# Patient Record
Sex: Male | Born: 1935 | Race: White | Hispanic: No | Marital: Married | Smoking: Former smoker
Health system: Southern US, Community
[De-identification: ages and names within clinical notes are randomized; demographics above are authoritative.]

## PROBLEM LIST (undated history)

## (undated) DIAGNOSIS — N400 Enlarged prostate without lower urinary tract symptoms: Secondary | ICD-10-CM

## (undated) DIAGNOSIS — G4730 Sleep apnea, unspecified: Secondary | ICD-10-CM

## (undated) DIAGNOSIS — K219 Gastro-esophageal reflux disease without esophagitis: Secondary | ICD-10-CM

## (undated) DIAGNOSIS — H3320 Serous retinal detachment, unspecified eye: Secondary | ICD-10-CM

## (undated) DIAGNOSIS — C801 Malignant (primary) neoplasm, unspecified: Secondary | ICD-10-CM

## (undated) DIAGNOSIS — E039 Hypothyroidism, unspecified: Secondary | ICD-10-CM

## (undated) DIAGNOSIS — H35379 Puckering of macula, unspecified eye: Secondary | ICD-10-CM

## (undated) DIAGNOSIS — Z860100 Personal history of colon polyps, unspecified: Secondary | ICD-10-CM

## (undated) DIAGNOSIS — R972 Elevated prostate specific antigen [PSA]: Secondary | ICD-10-CM

## (undated) DIAGNOSIS — H59023 Cataract (lens) fragments in eye following cataract surgery, bilateral: Secondary | ICD-10-CM

## (undated) DIAGNOSIS — D132 Benign neoplasm of duodenum: Secondary | ICD-10-CM

## (undated) HISTORY — DX: Cataract (lens) fragments in eye following cataract surgery, bilateral: H59.023

## (undated) HISTORY — DX: Personal history of colon polyps, unspecified: Z86.0100

## (undated) HISTORY — DX: Elevated prostate specific antigen (PSA): R97.20

## (undated) HISTORY — PX: OTHER SURGICAL HISTORY: SHX169

## (undated) HISTORY — DX: Benign neoplasm of duodenum: D13.2

## (undated) HISTORY — DX: Serous retinal detachment, unspecified eye: H33.20

## (undated) HISTORY — DX: Puckering of macula, unspecified eye: H35.379

## (undated) HISTORY — DX: Benign prostatic hyperplasia without lower urinary tract symptoms: N40.0

## (undated) HISTORY — PX: TONSILLECTOMY: SUR1361

## (undated) HISTORY — PX: UPPER GI ENDOSCOPY: SHX6162

## (undated) HISTORY — DX: Gastro-esophageal reflux disease without esophagitis: K21.9

## (undated) HISTORY — DX: Personal history of colonic polyps: Z86.010

---

## 1980-07-14 DIAGNOSIS — H3320 Serous retinal detachment, unspecified eye: Secondary | ICD-10-CM

## 1980-07-14 HISTORY — DX: Serous retinal detachment, unspecified eye: H33.20

## 1982-07-14 DIAGNOSIS — H35379 Puckering of macula, unspecified eye: Secondary | ICD-10-CM

## 1982-07-14 HISTORY — DX: Puckering of macula, unspecified eye: H35.379

## 1998-06-28 ENCOUNTER — Other Ambulatory Visit: Admission: RE | Admit: 1998-06-28 | Discharge: 1998-06-28 | Payer: Self-pay | Admitting: Urology

## 1999-07-04 ENCOUNTER — Other Ambulatory Visit: Admission: RE | Admit: 1999-07-04 | Discharge: 1999-07-04 | Payer: Self-pay | Admitting: Urology

## 1999-07-15 DIAGNOSIS — N400 Enlarged prostate without lower urinary tract symptoms: Secondary | ICD-10-CM

## 1999-07-15 HISTORY — DX: Benign prostatic hyperplasia without lower urinary tract symptoms: N40.0

## 2000-06-25 ENCOUNTER — Ambulatory Visit (HOSPITAL_COMMUNITY): Admission: RE | Admit: 2000-06-25 | Discharge: 2000-06-25 | Payer: Self-pay | Admitting: Gastroenterology

## 2000-07-14 DIAGNOSIS — D132 Benign neoplasm of duodenum: Secondary | ICD-10-CM

## 2000-07-14 HISTORY — DX: Benign neoplasm of duodenum: D13.2

## 2001-01-15 ENCOUNTER — Ambulatory Visit (HOSPITAL_COMMUNITY): Admission: RE | Admit: 2001-01-15 | Discharge: 2001-01-15 | Payer: Self-pay | Admitting: Gastroenterology

## 2003-09-11 ENCOUNTER — Inpatient Hospital Stay (HOSPITAL_COMMUNITY): Admission: RE | Admit: 2003-09-11 | Discharge: 2003-09-13 | Payer: Self-pay | Admitting: Gastroenterology

## 2003-10-05 ENCOUNTER — Ambulatory Visit (HOSPITAL_COMMUNITY): Admission: RE | Admit: 2003-10-05 | Discharge: 2003-10-05 | Payer: Self-pay | Admitting: Gastroenterology

## 2005-03-04 ENCOUNTER — Ambulatory Visit (HOSPITAL_BASED_OUTPATIENT_CLINIC_OR_DEPARTMENT_OTHER): Admission: RE | Admit: 2005-03-04 | Discharge: 2005-03-04 | Payer: Self-pay | Admitting: Internal Medicine

## 2005-03-06 ENCOUNTER — Encounter: Admission: RE | Admit: 2005-03-06 | Discharge: 2005-03-06 | Payer: Self-pay | Admitting: Orthopedic Surgery

## 2005-03-09 ENCOUNTER — Ambulatory Visit: Payer: Self-pay | Admitting: Internal Medicine

## 2005-04-02 ENCOUNTER — Encounter: Admission: RE | Admit: 2005-04-02 | Discharge: 2005-04-02 | Payer: Self-pay | Admitting: Orthopedic Surgery

## 2016-09-09 DIAGNOSIS — D225 Melanocytic nevi of trunk: Secondary | ICD-10-CM | POA: Diagnosis not present

## 2016-09-09 DIAGNOSIS — M7130 Other bursal cyst, unspecified site: Secondary | ICD-10-CM | POA: Diagnosis not present

## 2016-09-09 DIAGNOSIS — L821 Other seborrheic keratosis: Secondary | ICD-10-CM | POA: Diagnosis not present

## 2016-09-09 DIAGNOSIS — Z85828 Personal history of other malignant neoplasm of skin: Secondary | ICD-10-CM | POA: Diagnosis not present

## 2016-09-09 DIAGNOSIS — D2262 Melanocytic nevi of left upper limb, including shoulder: Secondary | ICD-10-CM | POA: Diagnosis not present

## 2016-09-09 DIAGNOSIS — L570 Actinic keratosis: Secondary | ICD-10-CM | POA: Diagnosis not present

## 2016-09-09 DIAGNOSIS — D1801 Hemangioma of skin and subcutaneous tissue: Secondary | ICD-10-CM | POA: Diagnosis not present

## 2016-10-15 DIAGNOSIS — D3131 Benign neoplasm of right choroid: Secondary | ICD-10-CM | POA: Diagnosis not present

## 2016-10-15 DIAGNOSIS — D3132 Benign neoplasm of left choroid: Secondary | ICD-10-CM | POA: Diagnosis not present

## 2016-10-15 DIAGNOSIS — H5203 Hypermetropia, bilateral: Secondary | ICD-10-CM | POA: Diagnosis not present

## 2016-10-15 DIAGNOSIS — H524 Presbyopia: Secondary | ICD-10-CM | POA: Diagnosis not present

## 2016-10-15 DIAGNOSIS — Z8669 Personal history of other diseases of the nervous system and sense organs: Secondary | ICD-10-CM | POA: Diagnosis not present

## 2016-10-15 DIAGNOSIS — Z961 Presence of intraocular lens: Secondary | ICD-10-CM | POA: Diagnosis not present

## 2016-10-15 DIAGNOSIS — H52203 Unspecified astigmatism, bilateral: Secondary | ICD-10-CM | POA: Diagnosis not present

## 2016-12-31 DIAGNOSIS — R972 Elevated prostate specific antigen [PSA]: Secondary | ICD-10-CM | POA: Diagnosis not present

## 2016-12-31 DIAGNOSIS — R946 Abnormal results of thyroid function studies: Secondary | ICD-10-CM | POA: Diagnosis not present

## 2017-05-18 DIAGNOSIS — N400 Enlarged prostate without lower urinary tract symptoms: Secondary | ICD-10-CM | POA: Diagnosis not present

## 2017-05-18 DIAGNOSIS — R310 Gross hematuria: Secondary | ICD-10-CM | POA: Diagnosis not present

## 2017-05-23 DIAGNOSIS — E663 Overweight: Secondary | ICD-10-CM | POA: Diagnosis not present

## 2017-05-23 DIAGNOSIS — Z87891 Personal history of nicotine dependence: Secondary | ICD-10-CM | POA: Diagnosis not present

## 2017-05-23 DIAGNOSIS — H9193 Unspecified hearing loss, bilateral: Secondary | ICD-10-CM | POA: Diagnosis not present

## 2017-05-23 DIAGNOSIS — K219 Gastro-esophageal reflux disease without esophagitis: Secondary | ICD-10-CM | POA: Diagnosis not present

## 2017-05-23 DIAGNOSIS — Z6826 Body mass index (BMI) 26.0-26.9, adult: Secondary | ICD-10-CM | POA: Diagnosis not present

## 2017-05-28 DIAGNOSIS — R310 Gross hematuria: Secondary | ICD-10-CM | POA: Diagnosis not present

## 2017-06-18 DIAGNOSIS — R310 Gross hematuria: Secondary | ICD-10-CM | POA: Diagnosis not present

## 2017-09-10 DIAGNOSIS — D485 Neoplasm of uncertain behavior of skin: Secondary | ICD-10-CM | POA: Diagnosis not present

## 2017-09-10 DIAGNOSIS — L570 Actinic keratosis: Secondary | ICD-10-CM | POA: Diagnosis not present

## 2017-09-10 DIAGNOSIS — L821 Other seborrheic keratosis: Secondary | ICD-10-CM | POA: Diagnosis not present

## 2017-09-10 DIAGNOSIS — C44329 Squamous cell carcinoma of skin of other parts of face: Secondary | ICD-10-CM | POA: Diagnosis not present

## 2017-09-10 DIAGNOSIS — D225 Melanocytic nevi of trunk: Secondary | ICD-10-CM | POA: Diagnosis not present

## 2017-09-10 DIAGNOSIS — Z85828 Personal history of other malignant neoplasm of skin: Secondary | ICD-10-CM | POA: Diagnosis not present

## 2017-09-11 DIAGNOSIS — Z125 Encounter for screening for malignant neoplasm of prostate: Secondary | ICD-10-CM | POA: Diagnosis not present

## 2017-09-11 DIAGNOSIS — R946 Abnormal results of thyroid function studies: Secondary | ICD-10-CM | POA: Diagnosis not present

## 2017-09-11 DIAGNOSIS — E7849 Other hyperlipidemia: Secondary | ICD-10-CM | POA: Diagnosis not present

## 2017-09-11 DIAGNOSIS — R82998 Other abnormal findings in urine: Secondary | ICD-10-CM | POA: Diagnosis not present

## 2017-09-28 DIAGNOSIS — K219 Gastro-esophageal reflux disease without esophagitis: Secondary | ICD-10-CM | POA: Diagnosis not present

## 2017-09-28 DIAGNOSIS — D369 Benign neoplasm, unspecified site: Secondary | ICD-10-CM | POA: Diagnosis not present

## 2017-09-28 DIAGNOSIS — R972 Elevated prostate specific antigen [PSA]: Secondary | ICD-10-CM | POA: Diagnosis not present

## 2017-09-28 DIAGNOSIS — R946 Abnormal results of thyroid function studies: Secondary | ICD-10-CM | POA: Diagnosis not present

## 2017-09-28 DIAGNOSIS — L0889 Other specified local infections of the skin and subcutaneous tissue: Secondary | ICD-10-CM | POA: Diagnosis not present

## 2017-09-28 DIAGNOSIS — Z0000 Encounter for general adult medical examination without abnormal findings: Secondary | ICD-10-CM | POA: Diagnosis not present

## 2017-09-28 DIAGNOSIS — E7849 Other hyperlipidemia: Secondary | ICD-10-CM | POA: Diagnosis not present

## 2017-09-28 DIAGNOSIS — Z8711 Personal history of peptic ulcer disease: Secondary | ICD-10-CM | POA: Diagnosis not present

## 2017-09-28 DIAGNOSIS — N401 Enlarged prostate with lower urinary tract symptoms: Secondary | ICD-10-CM | POA: Diagnosis not present

## 2017-10-01 DIAGNOSIS — Z1212 Encounter for screening for malignant neoplasm of rectum: Secondary | ICD-10-CM | POA: Diagnosis not present

## 2017-10-19 DIAGNOSIS — D3131 Benign neoplasm of right choroid: Secondary | ICD-10-CM | POA: Diagnosis not present

## 2017-10-19 DIAGNOSIS — C44329 Squamous cell carcinoma of skin of other parts of face: Secondary | ICD-10-CM | POA: Diagnosis not present

## 2017-10-19 DIAGNOSIS — Z85828 Personal history of other malignant neoplasm of skin: Secondary | ICD-10-CM | POA: Diagnosis not present

## 2017-10-19 DIAGNOSIS — Z961 Presence of intraocular lens: Secondary | ICD-10-CM | POA: Diagnosis not present

## 2017-10-19 DIAGNOSIS — D3132 Benign neoplasm of left choroid: Secondary | ICD-10-CM | POA: Diagnosis not present

## 2017-10-19 DIAGNOSIS — Z8669 Personal history of other diseases of the nervous system and sense organs: Secondary | ICD-10-CM | POA: Diagnosis not present

## 2017-10-25 DIAGNOSIS — H9193 Unspecified hearing loss, bilateral: Secondary | ICD-10-CM | POA: Diagnosis not present

## 2017-10-25 DIAGNOSIS — K219 Gastro-esophageal reflux disease without esophagitis: Secondary | ICD-10-CM | POA: Diagnosis not present

## 2017-10-25 DIAGNOSIS — Z6825 Body mass index (BMI) 25.0-25.9, adult: Secondary | ICD-10-CM | POA: Diagnosis not present

## 2018-03-20 DIAGNOSIS — Z23 Encounter for immunization: Secondary | ICD-10-CM | POA: Diagnosis not present

## 2018-03-23 DIAGNOSIS — R002 Palpitations: Secondary | ICD-10-CM | POA: Diagnosis not present

## 2018-03-23 DIAGNOSIS — Z6825 Body mass index (BMI) 25.0-25.9, adult: Secondary | ICD-10-CM | POA: Diagnosis not present

## 2018-03-23 DIAGNOSIS — R946 Abnormal results of thyroid function studies: Secondary | ICD-10-CM | POA: Diagnosis not present

## 2018-04-08 ENCOUNTER — Other Ambulatory Visit: Payer: Self-pay | Admitting: Internal Medicine

## 2018-04-08 ENCOUNTER — Ambulatory Visit (INDEPENDENT_AMBULATORY_CARE_PROVIDER_SITE_OTHER): Payer: Medicare PPO

## 2018-04-08 DIAGNOSIS — R002 Palpitations: Secondary | ICD-10-CM | POA: Diagnosis not present

## 2018-04-13 DIAGNOSIS — M7541 Impingement syndrome of right shoulder: Secondary | ICD-10-CM | POA: Diagnosis not present

## 2018-04-13 DIAGNOSIS — M25511 Pain in right shoulder: Secondary | ICD-10-CM | POA: Diagnosis not present

## 2018-04-19 ENCOUNTER — Telehealth: Payer: Self-pay | Admitting: *Deleted

## 2018-04-19 NOTE — Telephone Encounter (Signed)
lmtcb to discuss monitor readings   Received 2 critical [employer] reports: 1)  04/16/18, 11:00 pm, showing what looks like AFib w/ rate of 198. 2) 04/18/18, 3:50 pm, showing SVT/AFib w rate pf 186.  Pt reports feeling dizzy yesterday around the time monitor showed elevated HRs.  He reported his pulse in the 150/160s. Reviewed w/ DOD, Dr. Radford Pax, who recommends EP referral w/ appt this week d/t symptomatic w/ elevated HRs. Left message for patient to call back to discuss.

## 2018-04-19 NOTE — Telephone Encounter (Signed)
Dr. Caryl Comes reviewed monitor report. Advised pt to go to ED if he experienced same symptoms as yesterday when his HR was elevated. Explained rationale. Pt agreeable to plan.

## 2018-04-19 NOTE — Telephone Encounter (Signed)
Pt scheduled to discuss/review more with Dr. Caryl Comes on Thursday.  Lorren, RN notified. Pt is agreeable to plan.

## 2018-04-22 ENCOUNTER — Ambulatory Visit: Payer: Medicare PPO | Admitting: Internal Medicine

## 2018-04-22 ENCOUNTER — Encounter: Payer: Self-pay | Admitting: Internal Medicine

## 2018-04-22 VITALS — BP 111/70 | HR 80 | Ht 70.5 in | Wt 169.8 lb

## 2018-04-22 DIAGNOSIS — R55 Syncope and collapse: Secondary | ICD-10-CM

## 2018-04-22 DIAGNOSIS — I4891 Unspecified atrial fibrillation: Secondary | ICD-10-CM | POA: Diagnosis not present

## 2018-04-22 DIAGNOSIS — I472 Ventricular tachycardia, unspecified: Secondary | ICD-10-CM

## 2018-04-22 DIAGNOSIS — I4510 Unspecified right bundle-branch block: Secondary | ICD-10-CM

## 2018-04-22 NOTE — Patient Instructions (Addendum)
Medication Instructions:  Your physician recommends that you continue on your current medications as directed. Please refer to the Current Medication list given to you today.  If you need a refill on your cardiac medications before your next appointment, please call your pharmacy.   Lab work: You will have labs drawn today: CBC and BMP  If you have labs (blood work) drawn today and your tests are completely normal, you will receive your results only by: Marland Kitchen MyChart Message (if you have MyChart) OR . A paper copy in the mail If you have any lab test that is abnormal or we need to change your treatment, we will call you to review the results.  Testing/Procedures:   Follow-Up: At [facility], you and your health needs are our priority.  As part of our continuing mission to provide you with exceptional heart care, we have created designated Provider Care Teams.  These Care Teams include your primary Cardiologist (physician) and Advanced Practice Providers (APPs -  Physician Assistants and Nurse Practitioners) who all work together to provide you with the care you need, when you need it.   You will need a follow up appointment in 4 weeks.  Please call our office 2 months in advance to schedule this appointment.  You may see Dr Caryl Comes.   Any Other Special Instructions Will Be Listed Below (If Applicable).      [facility] OFF[facility] [place], [place] [address] Dept: [phone] Loc: [phone]  Jason Davies  04/22/2018  You are scheduled for a Cardiac Catheterization on Friday, October 11 with Dr. Daneen Schick.  1. Please arrive at the [facility] (Main Entrance A) at [facility]: [address] [place], [place] [address] at 9:00 AM (This time is two hours before your procedure to ensure your preparation). Free valet parking service is available.   Special note: Every effort  is made to have your procedure done on time. Please understand that emergencies sometimes delay scheduled procedures.  2. Diet: Do not eat solid foods after midnight.  The patient may have clear liquids until 5am upon the day of the procedure.  3. Labs: You will need to have blood drawn on Thursday, October 10 at [facility]. [address]  Open: 7:30am - 5pm    Phone: [phone]. You do not need to be fasting.  4. Medication instructions in preparation for your procedure:   Contrast Allergy: No  On the morning of your procedure, take your Aspirin, 81mg , and any morning medicines NOT listed above.  You may use sips of water.  5. Plan for one night stay--bring personal belongings. 6. Bring a current list of your medications and current insurance cards. 7. You MUST have a responsible person to drive you home. 8. Someone MUST be with you the first 24 hours after you arrive home or your discharge will be delayed. 9. Please wear clothes that are easy to get on and off and wear slip-on shoes.  Thank you for allowing [place] to care for you!   -- [facility]

## 2018-04-22 NOTE — Progress Notes (Signed)
ELECTROPHYSIOLOGY CONSULT NOTE  Patient ID: Jason Davies, MRN: [id number], DOB/AGE: 82/09/1927 82 y.o. Admit date: (Not on file) Date of Consult: 04/22/2018  Primary Physician: Crist Infante, MD Primary Cardiologist: new     Jason Davies is a 84 y.o. male who is being seen today for the evaluation of syncope at the request of Dr Joylene Draft.    HPI Jason Davies is a 84 y.o. male has had a few episodes of presyncope associated with a warming sensation descending over his body.  They are unassociated with palpitations.  He was given an event recorder that demonstrated an irregular wide-complex tachycardia turns out to be consistent with ventricular tachycardia.   He has an underlying right bundle branch block.  He also has sinus beats recorded on his monitor with a negative QRS morphology as opposed to the right bundle morphology  He denies chest pain shortness of breath peripheral edema  No family history of heart disease  No hypertension or diabetes.       Past Medical History:  Diagnosis Date  . Adenoma of duodenum 2002   stomach ulcer treated then, abx given, then referred to [facility] 10/2 through 2006 had serial endoscopies 9 or 10 times. Nipping away at the adenoma. It was close to a blood vessel. Got it all  . BPH (benign prostatic hyperplasia) 2001   s/p two turps  . Cataract (lens) fragments in eye following cataract surgery, bilateral   . Detached retina 1982  . GERD (gastroesophageal reflux disease)   . History of colon polyps    06/2005 due for next colon check. maybe has had some colon polyps in the past.  . Increased prostate specific antigen (PSA) velocity    12/17 5.7, was 3.8 in 2011. will recheck in 6 mos and go from there.  . Macular pucker 1984   surgery at [place] [facility]       Surgical History: History reviewed. No pertinent surgical history.   Home Meds: Prior to Admission medications   Medication Sig Start Date End Date Taking? Authorizing Provider    omeprazole (PRILOSEC) 20 MG capsule Take 20 mg by mouth daily.   Yes [provider]    Allergies:  Allergies  Allergen Reactions  . Mucinex D [Pseudoephedrine-Guaifenesin Er] Other (See Comments)    Patient passes out when he takes Mucinex D    Social History   Socioeconomic History  . Marital status: Married    Spouse name: [name]  . Number of children: 2  . Years of education: Not on file  . Highest education level: Not on file  Occupational History  . Not on file  Social Needs  . Financial resource strain: Not on file  . Food insecurity:    Worry: Not on file    Inability: Not on file  . Transportation needs:    Medical: Not on file    Non-medical: Not on file  Tobacco Use  . Smoking status: Former Smoker    Years: 12.00    Types: Pipe    Start date: 71  . Smokeless tobacco: Never Used  Substance and Sexual Activity  . Alcohol use: Not on file  . Drug use: Not on file  . Sexual activity: Not on file  Lifestyle  . Physical activity:    Days per week: Not on file    Minutes per session: Not on file  . Stress: Not on file  Relationships  . Social connections:  Talks on phone: Not on file    Gets together: Not on file    Attends religious service: Not on file    Active member of club or organization: Not on file    Attends meetings of clubs or organizations: Not on file    Relationship status: Not on file  . Intimate partner violence:    Fear of current or ex partner: Not on file    Emotionally abused: Not on file    Physically abused: Not on file    Forced sexual activity: Not on file  Other Topics Concern  . Not on file  Social History Narrative   [name] and [name]-childern   6 GC, 4 girls and 2 boys all good in 2016 all good in 2017   1950 smoked a pipe from then for 12 years, never cigs   No drugs   Etoh: glass of wine or borbon one daily   Summer likes a few beers   Work: Forensic psychologist, Optician, dispensing. Estates, small businesses and  real estate   Hobbies:walks daily, wrote a book in 2016 (about the civil war) letters from his grandfather who fought in the war and writing another as of 2019.   Does geneology   His grandfather fought in the civil war, he was born in 14.    He and his 3 brothers were all in the service.   New rescue dog 2019, part coker spaniel and part [place].         Family History  Problem Relation Age of Onset  . Other Father        ileitis, intestinal issue, gangrene set in  . Breast cancer Sister   . Other Sister        intestinal issue     ROS:  Please see the history of present illness.     All other systems reviewed and negative.    Physical Exam: Blood pressure 111/70, pulse 80, height 5' 10.5" (1.791 m), weight 169 lb 12.8 oz (77 kg), SpO2 94 %. General: Well developed, well nourished male in no acute distress. Head: Normocephalic, atraumatic, sclera non-icteric, no xanthomas, nares are without discharge. EENT: normal  Lymph Nodes:  none Neck: Negative for carotid bruits. JVD not elevated. Back:without scoliosis kyphosi Lungs: Clear bilaterally to auscultation without wheezes, rales, or rhonchi. Breathing is unlabored. Heart: RRR with S1 S2. No  murmur . No rubs, or gallops appreciated. Abdomen: Soft, non-tender, non-distended with normoactive bowel sounds. No hepatomegaly. No rebound/guarding. No obvious abdominal masses. Msk:  Strength and tone appear normal for age. Extremities: No clubbing or cyanosis. No edema.  Distal pedal pulses are 2+ and equal bilaterally. Skin: Warm and Dry Neuro: Alert and oriented X 3. CN III-XII intact Grossly normal sensory and motor function . Psych:  Responds to questions appropriately with a normal affect.      Labs: Cardiac Enzymes No results for input(s): CKTOTAL, CKMB, TROPONINI in the last 72 hours. CBC No results found for: WBC, HGB, HCT, MCV, PLT PROTIME: No results for input(s): LABPROT, INR in the last 72 hours. Chemistry  No results for input(s): NA, K, CL, CO2, BUN, CREATININE, CALCIUM, PROT, BILITOT, ALKPHOS, ALT, AST, GLUCOSE in the last 168 hours.  Invalid input(s): LABALBU Lipids No results found for: CHOL, HDL, LDLCALC, TRIG BNP No results found for: PROBNP Thyroid Function Tests: No results for input(s): TSH, T4TOTAL, T3FREE, THYROIDAB in the last 72 hours.  Invalid input(s): FREET3 Miscellaneous No results found for: DDIMER  Radiology/Studies:  No results found.  EKG: Sinus at 80 Interval 17/13/37 Right bundle branch block  Event recorder strip dated 04/18/2018 wide-complex irregular tachycardia duration greater than 30 seconds onset offset not available terrific so when she comes tomorrow we can actually call her today after she was to come to [facility] and she can either wait to see them or have her come back tomorrow so she prefers to call the way in the first ECG recorded as A. fib with PVCs this echo and they think is sinus but my guess is read by PA on 5 wrong lateral when compared with the other one no significant change yes/no questions can recall and this is echocardiograms with a normal stent of the right so I am not ready go see my [place] I keep track to get so we have a friend who called the CPT right they can say that we can other from [place] but I think is triggered   Assessment and Plan:   Ventricular tachycardia  Right bundle branch block  Sinus beats with a different QRS axis   The patient has nonsustained symptomatic ventricular tachycardia in the setting of no known structural heart disease.  No symptoms of angina, but I think with recurrent runs of nonsustained VT ischemia needs to be excluded.  This will also give [place] information related to drug therapy options and left ventricular function.  In the event that his LV function is abnormal but he does not have coronary disease would recommend echo and cMRI prior to making a decision regarding therapeutic options  although would probably lean towards amiodarone.  However, we also have variable antegrade conduction.  He has baseline right bundle branch block although the QRS duration is not that wide suggesting that the conduction through his right bundle is impaired but not blocked and the differently oriented QRS complexes that seem appropriate related to the P wave might then be relative slowing down the left bundle allowing for more narrow QRS and ventricular activation as opposed to alternate bundle branch block  I discussed with him catheterization the potential benefits and risks   Virl Axe

## 2018-04-22 NOTE — H&P (View-Only) (Signed)
ELECTROPHYSIOLOGY CONSULT NOTE  Patient ID: Jason Davies, MRN: [id number], DOB/AGE: [birth year] 84 y.o. Admit date: (Not on file) Date of Consult: 04/22/2018  Primary Physician: Jason Infante, MD Primary Cardiologist: new     Jason Davies is a 84 y.o. male who is being seen today for the evaluation of syncope at the request of Dr Jason Davies.    HPI Jason Davies is a 84 y.o. male has had a few episodes of presyncope associated with a warming sensation descending over his body.  They are unassociated with palpitations.  He was given an event recorder that demonstrated an irregular wide-complex tachycardia turns out to be consistent with ventricular tachycardia.   He has an underlying right bundle branch block.  He also has sinus beats recorded on his monitor with a negative QRS morphology as opposed to the right bundle morphology  He denies chest pain shortness of breath peripheral edema  No family history of heart disease  No hypertension or diabetes.       Past Medical History:  Diagnosis Date  . Adenoma of duodenum 2002   stomach ulcer treated then, abx given, then referred to [facility] 10/2 through 2006 had serial endoscopies 9 or 10 times. Nipping away at the adenoma. It was close to a blood vessel. Got it all  . BPH (benign prostatic hyperplasia) 2001   s/p two turps  . Cataract (lens) fragments in eye following cataract surgery, bilateral   . Detached retina 1982  . GERD (gastroesophageal reflux disease)   . History of colon polyps    06/2005 due for next colon check. maybe has had some colon polyps in the past.  . Increased prostate specific antigen (PSA) velocity    12/17 5.7, was 3.8 in 2011. will recheck in 6 mos and go from there.  . Macular pucker 1984   surgery at [place]       Surgical History: History reviewed. No pertinent surgical history.   Home Meds: Prior to Admission medications   Medication Sig Start Date End Date Taking? Authorizing Provider    omeprazole (PRILOSEC) 20 MG capsule Take 20 mg by mouth daily.   Yes [provider]    Allergies:  Allergies  Allergen Reactions  . Mucinex D [Pseudoephedrine-Guaifenesin Er] Other (See Comments)    Patient passes out when he takes Mucinex D    Social History   Socioeconomic History  . Marital status: Married    Spouse name: Jason Davies  . Number of children: 2  . Years of education: Not on file  . Highest education level: Not on file  Occupational History  . Not on file  Social Needs  . Financial resource strain: Not on file  . Food insecurity:    Worry: Not on file    Inability: Not on file  . Transportation needs:    Medical: Not on file    Non-medical: Not on file  Tobacco Use  . Smoking status: Former Smoker    Years: 12.00    Types: Pipe    Start date: 33  . Smokeless tobacco: Never Used  Substance and Sexual Activity  . Alcohol use: Not on file  . Drug use: Not on file  . Sexual activity: Not on file  Lifestyle  . Physical activity:    Days per week: Not on file    Minutes per session: Not on file  . Stress: Not on file  Relationships  . Social connections:  Talks on phone: Not on file    Gets together: Not on file    Attends religious service: Not on file    Active member of club or organization: Not on file    Attends meetings of clubs or organizations: Not on file    Relationship status: Not on file  . Intimate partner violence:    Fear of current or ex partner: Not on file    Emotionally abused: Not on file    Physically abused: Not on file    Forced sexual activity: Not on file  Other Topics Concern  . Not on file  Social History Narrative   Jason Davies and Jason Davies-childern   6 GC, 4 girls and 2 boys all good in 2016 all good in 2017   1950 smoked a pipe from then for 12 years, never cigs   No drugs   Etoh: glass of wine or borbon one daily   Summer likes a few beers   Work: Forensic psychologist, Optician, dispensing. Estates, small businesses and  real estate   Hobbies:walks daily, wrote a book in 2016 (about the civil war) letters from his grandfather who fought in the war and writing another as of 2019.   Does geneology   His grandfather fought in the civil war, he was born in [birth year].    He and his 3 brothers were all in the service.   New rescue dog 2019, part coker spaniel and part [place].         Family History  Problem Relation Age of Onset  . Other Father        ileitis, intestinal issue, gangrene set in  . Breast cancer Sister   . Other Sister        intestinal issue     ROS:  Please see the history of present illness.     All other systems reviewed and negative.    Physical Exam: Blood pressure 111/70, pulse 80, height 5' 10.5" (1.791 m), weight 169 lb 12.8 oz (77 kg), SpO2 94 %. General: Well developed, well nourished male in no acute distress. Head: Normocephalic, atraumatic, sclera non-icteric, no xanthomas, nares are without discharge. EENT: normal  Lymph Nodes:  none Neck: Negative for carotid bruits. JVD not elevated. Back:without scoliosis kyphosi Lungs: Clear bilaterally to auscultation without wheezes, rales, or rhonchi. Breathing is unlabored. Heart: RRR with S1 S2. No  murmur . No rubs, or gallops appreciated. Abdomen: Soft, non-tender, non-distended with normoactive bowel sounds. No hepatomegaly. No rebound/guarding. No obvious abdominal masses. Msk:  Strength and tone appear normal for age. Extremities: No clubbing or cyanosis. No edema.  Distal pedal pulses are 2+ and equal bilaterally. Skin: Warm and Dry Neuro: Alert and oriented X 3. CN III-XII intact Grossly normal sensory and motor function . Psych:  Responds to questions appropriately with a normal affect.      Labs: Cardiac Enzymes No results for input(s): CKTOTAL, CKMB, TROPONINI in the last 72 hours. CBC No results found for: WBC, HGB, HCT, MCV, PLT PROTIME: No results for input(s): LABPROT, INR in the last 72 hours. Chemistry  No results for input(s): NA, K, CL, CO2, BUN, CREATININE, CALCIUM, PROT, BILITOT, ALKPHOS, ALT, AST, GLUCOSE in the last 168 hours.  Invalid input(s): LABALBU Lipids No results found for: CHOL, HDL, LDLCALC, TRIG BNP No results found for: PROBNP Thyroid Function Tests: No results for input(s): TSH, T4TOTAL, T3FREE, THYROIDAB in the last 72 hours.  Invalid input(s): FREET3 Miscellaneous No results found for: DDIMER  Radiology/Studies:  No results found.  EKG: Sinus at 80 Interval 17/13/37 Right bundle branch block  Event recorder strip dated 04/18/2018 wide-complex irregular tachycardia duration greater than 30 seconds onset offset not available terrific so when she comes tomorrow we can actually call her today after she was to come to [facility] and she can either wait to see them or have her come back tomorrow so she prefers to call the way in the first ECG recorded as A. fib with PVCs this echo and they think is sinus but my guess is read by PA on 5 wrong lateral when compared with the other one no significant change yes/no questions can recall and this is echocardiograms with a normal stent of the right so I am not ready go see my [place] I keep track to get so we have a friend who called the CPT right they can say that we can other from [place] but I think is triggered   Assessment and Plan:   Ventricular tachycardia  Right bundle branch block  Sinus beats with a different QRS axis   The patient has nonsustained symptomatic ventricular tachycardia in the setting of no known structural heart disease.  No symptoms of angina, but I think with recurrent runs of nonsustained VT ischemia needs to be excluded.  This will also give [place] information related to drug therapy options and left ventricular function.  In the event that his LV function is abnormal but he does not have coronary disease would recommend echo and cMRI prior to making a decision regarding therapeutic options  although would probably lean towards amiodarone.  However, we also have variable antegrade conduction.  He has baseline right bundle branch block although the QRS duration is not that wide suggesting that the conduction through his right bundle is impaired but not blocked and the differently oriented QRS complexes that seem appropriate related to the P wave might then be relative slowing down the left bundle allowing for more narrow QRS and ventricular activation as opposed to alternate bundle branch block  I discussed with him catheterization the potential benefits and risks   Virl Axe

## 2018-04-23 ENCOUNTER — Ambulatory Visit (HOSPITAL_COMMUNITY)
Admission: AD | Admit: 2018-04-23 | Discharge: 2018-04-23 | Disposition: A | Discharge status: Home / Self Care | Payer: Medicare PPO | Source: Ambulatory Visit | Attending: Interventional Cardiology | Admitting: Interventional Cardiology

## 2018-04-23 ENCOUNTER — Encounter (HOSPITAL_COMMUNITY)
Admission: AD | Disposition: A | Discharge status: Home / Self Care | Payer: Self-pay | Source: Ambulatory Visit | Attending: Interventional Cardiology

## 2018-04-23 ENCOUNTER — Encounter (HOSPITAL_COMMUNITY): Payer: Self-pay | Admitting: Interventional Cardiology

## 2018-04-23 ENCOUNTER — Other Ambulatory Visit: Payer: Self-pay

## 2018-04-23 DIAGNOSIS — I472 Ventricular tachycardia, unspecified: Secondary | ICD-10-CM

## 2018-04-23 DIAGNOSIS — I4510 Unspecified right bundle-branch block: Secondary | ICD-10-CM

## 2018-04-23 DIAGNOSIS — I2584 Coronary atherosclerosis due to calcified coronary lesion: Secondary | ICD-10-CM | POA: Diagnosis not present

## 2018-04-23 DIAGNOSIS — H269 Unspecified cataract: Secondary | ICD-10-CM | POA: Diagnosis not present

## 2018-04-23 DIAGNOSIS — N400 Enlarged prostate without lower urinary tract symptoms: Secondary | ICD-10-CM | POA: Diagnosis not present

## 2018-04-23 DIAGNOSIS — Z888 Allergy status to other drugs, medicaments and biological substances status: Secondary | ICD-10-CM | POA: Insufficient documentation

## 2018-04-23 DIAGNOSIS — K219 Gastro-esophageal reflux disease without esophagitis: Secondary | ICD-10-CM | POA: Insufficient documentation

## 2018-04-23 DIAGNOSIS — Z87891 Personal history of nicotine dependence: Secondary | ICD-10-CM | POA: Insufficient documentation

## 2018-04-23 DIAGNOSIS — R002 Palpitations: Secondary | ICD-10-CM | POA: Insufficient documentation

## 2018-04-23 DIAGNOSIS — Z8719 Personal history of other diseases of the digestive system: Secondary | ICD-10-CM | POA: Diagnosis not present

## 2018-04-23 DIAGNOSIS — Z79899 Other long term (current) drug therapy: Secondary | ICD-10-CM | POA: Insufficient documentation

## 2018-04-23 DIAGNOSIS — I2510 Atherosclerotic heart disease of native coronary artery without angina pectoris: Secondary | ICD-10-CM | POA: Insufficient documentation

## 2018-04-23 DIAGNOSIS — Z8379 Family history of other diseases of the digestive system: Secondary | ICD-10-CM | POA: Diagnosis not present

## 2018-04-23 DIAGNOSIS — R55 Syncope and collapse: Secondary | ICD-10-CM

## 2018-04-23 DIAGNOSIS — Z86010 Personal history of colonic polyps: Secondary | ICD-10-CM | POA: Insufficient documentation

## 2018-04-23 HISTORY — PX: LEFT HEART CATH AND CORONARY ANGIOGRAPHY: CATH118249

## 2018-04-23 LAB — CBC
HEMATOCRIT: 44.4 % (ref 37.5–51.0)
Hemoglobin: 15.5 g/dL (ref 13.0–17.7)
MCH: 31 pg (ref 26.6–33.0)
MCHC: 34.9 g/dL (ref 31.5–35.7)
MCV: 89 fL (ref 79–97)
PLATELETS: 198 10*3/uL (ref 150–450)
RBC: 5 x10E6/uL (ref 4.14–5.80)
RDW: 13.6 % (ref 12.3–15.4)
WBC: 9.9 10*3/uL (ref 3.4–10.8)

## 2018-04-23 LAB — BASIC METABOLIC PANEL
BUN / CREAT RATIO: 22 (ref 10–24)
BUN: 24 mg/dL (ref 8–27)
CHLORIDE: 100 mmol/L (ref 96–106)
CO2: 26 mmol/L (ref 20–29)
Calcium: 9 mg/dL (ref 8.6–10.2)
Creatinine, Ser: 1.08 mg/dL (ref 0.76–1.27)
GFR calc Af Amer: 71 mL/min/{1.73_m2} (ref >59)
GFR calc non Af Amer: 61 mL/min/{1.73_m2} (ref >59)
GLUCOSE: 89 mg/dL (ref 65–99)
POTASSIUM: 4.3 mmol/L (ref 3.5–5.2)
Sodium: 142 mmol/L (ref 134–144)

## 2018-04-23 SURGERY — LEFT HEART CATH AND CORONARY ANGIOGRAPHY
Anesthesia: LOCAL

## 2018-04-23 MED ORDER — SODIUM CHLORIDE 0.9 % WEIGHT BASED INFUSION
3.0000 mL/kg/h | INTRAVENOUS | Status: AC
  Administered 2018-04-23: 3 mL/kg/h via INTRAVENOUS

## 2018-04-23 MED ORDER — IOHEXOL 350 MG/ML SOLN
INTRAVENOUS | Status: DC | PRN
  Administered 2018-04-23: 65 mL via INTRACARDIAC

## 2018-04-23 MED ORDER — MIDAZOLAM HCL 2 MG/2ML IJ SOLN
INTRAMUSCULAR | Status: AC
  Filled 2018-04-23: qty 2

## 2018-04-23 MED ORDER — LIDOCAINE HCL (PF) 1 % IJ SOLN
INTRAMUSCULAR | Status: DC | PRN
  Administered 2018-04-23: 2 mL

## 2018-04-23 MED ORDER — SODIUM CHLORIDE 0.9 % IV SOLN: 250.0000 mL | INTRAVENOUS | Status: DC | PRN

## 2018-04-23 MED ORDER — SODIUM CHLORIDE 0.9% FLUSH: 3.0000 mL | Freq: Two times a day (BID) | INTRAVENOUS | Status: DC

## 2018-04-23 MED ORDER — SODIUM CHLORIDE 0.9 % IV SOLN: INTRAVENOUS | Status: DC

## 2018-04-23 MED ORDER — MIDAZOLAM HCL 2 MG/2ML IJ SOLN
INTRAMUSCULAR | Status: DC | PRN
  Administered 2018-04-23: 1 mg via INTRAVENOUS

## 2018-04-23 MED ORDER — FENTANYL CITRATE (PF) 100 MCG/2ML IJ SOLN
INTRAMUSCULAR | Status: DC | PRN
  Administered 2018-04-23: 25 ug via INTRAVENOUS

## 2018-04-23 MED ORDER — OXYCODONE HCL 5 MG PO TABS: 5.0000 mg | ORAL_TABLET | ORAL | Status: DC | PRN

## 2018-04-23 MED ORDER — ONDANSETRON HCL 4 MG/2ML IJ SOLN: 4.0000 mg | Freq: Four times a day (QID) | INTRAMUSCULAR | Status: DC | PRN

## 2018-04-23 MED ORDER — ASPIRIN 81 MG PO CHEW: 81.0000 mg | CHEWABLE_TABLET | ORAL | Status: DC

## 2018-04-23 MED ORDER — FENTANYL CITRATE (PF) 100 MCG/2ML IJ SOLN
INTRAMUSCULAR | Status: AC
  Filled 2018-04-23: qty 2

## 2018-04-23 MED ORDER — SODIUM CHLORIDE 0.9 % WEIGHT BASED INFUSION: 1.0000 mL/kg/h | INTRAVENOUS | Status: DC

## 2018-04-23 MED ORDER — LIDOCAINE HCL (PF) 1 % IJ SOLN
INTRAMUSCULAR | Status: AC
  Filled 2018-04-23: qty 30

## 2018-04-23 MED ORDER — HEPARIN (PORCINE) IN NACL 1000-0.9 UT/500ML-% IV SOLN
INTRAVENOUS | Status: DC | PRN
  Administered 2018-04-23 (×2): 500 mL

## 2018-04-23 MED ORDER — HEPARIN (PORCINE) IN NACL 1000-0.9 UT/500ML-% IV SOLN
INTRAVENOUS | Status: AC
  Filled 2018-04-23: qty 500

## 2018-04-23 MED ORDER — SODIUM CHLORIDE 0.9% FLUSH: 3.0000 mL | INTRAVENOUS | Status: DC | PRN

## 2018-04-23 MED ORDER — VERAPAMIL HCL 2.5 MG/ML IV SOLN
INTRAVENOUS | Status: DC | PRN
  Administered 2018-04-23: 10 mL via INTRA_ARTERIAL

## 2018-04-23 MED ORDER — VERAPAMIL HCL 2.5 MG/ML IV SOLN
INTRAVENOUS | Status: AC
  Filled 2018-04-23: qty 2

## 2018-04-23 MED ORDER — HEPARIN SODIUM (PORCINE) 1000 UNIT/ML IJ SOLN
INTRAMUSCULAR | Status: DC | PRN
  Administered 2018-04-23: 4000 [IU] via INTRAVENOUS

## 2018-04-23 MED ORDER — ACETAMINOPHEN 325 MG PO TABS: 650.0000 mg | ORAL_TABLET | ORAL | Status: DC | PRN

## 2018-04-23 SURGICAL SUPPLY — 11 items
CATH INFINITI 5 FR JL3.5 (CATHETERS) ×2 IMPLANT
CATH INFINITI JR4 5F (CATHETERS) ×2 IMPLANT
DEVICE RAD COMP TR BAND LRG (VASCULAR PRODUCTS) ×2 IMPLANT
GLIDESHEATH SLEND A-KIT 6F 22G (SHEATH) ×2 IMPLANT
GUIDEWIRE INQWIRE 1.5J.035X260 (WIRE) ×1 IMPLANT
INQWIRE 1.5J .035X260CM (WIRE) ×2
KIT HEART LEFT (KITS) ×2 IMPLANT
PACK CARDIAC CATHETERIZATION (CUSTOM PROCEDURE TRAY) ×2 IMPLANT
SHEATH PROBE COVER 6X72 (BAG) ×2 IMPLANT
TRANSDUCER W/STOPCOCK (MISCELLANEOUS) ×2 IMPLANT
TUBING CIL FLEX 10 FLL-RA (TUBING) ×2 IMPLANT

## 2018-04-23 NOTE — Discharge Instructions (Signed)

## 2018-04-23 NOTE — Interval H&P Note (Signed)
Cath Lab Visit (complete for each Cath Lab visit)  Clinical Evaluation Leading to the Procedure:   ACS: No.  Non-ACS:    Anginal Classification: CCS I  Anti-ischemic medical therapy: No Therapy  Non-Invasive Test Results: No non-invasive testing performed  Prior CABG: No previous CABG      History and Physical Interval Note:  04/23/2018 10:59 AM  Jason Davies  has presented today for surgery, with the diagnosis of VT  The various methods of treatment have been discussed with the patient and family. After consideration of risks, benefits and other options for treatment, the patient has consented to  Procedure(s): LEFT HEART CATH AND CORONARY ANGIOGRAPHY (N/A) as a surgical intervention .  The patient's history has been reviewed, patient examined, no change in status, stable for surgery.  I have reviewed the patient's chart and labs.  Questions were answered to the patient's satisfaction.     Belva Crome III

## 2018-04-26 ENCOUNTER — Telehealth: Payer: Self-pay | Admitting: Internal Medicine

## 2018-04-26 DIAGNOSIS — I472 Ventricular tachycardia, unspecified: Secondary | ICD-10-CM

## 2018-04-26 MED ORDER — FLECAINIDE ACETATE 100 MG PO TABS: 100.0000 mg | ORAL_TABLET | Freq: Two times a day (BID) | ORAL | 3 refills | Status: DC

## 2018-04-26 NOTE — Telephone Encounter (Signed)
New Message   Patient is calling because he just had an a procedure with Dr. Tamala Julian and he was under the impression that he is to start a medication. But he is not absolutely sure. Please call to discuss.

## 2018-04-26 NOTE — Telephone Encounter (Signed)
Spoke with Dr Caryl Comes. Pt is to begin Flecainide 100mg  bid. Follow up EKG has been scheduled for next week. Dr Caryl Comes also would like him to have a Cardiac MRI scheduled as well. Pt has agreed to all recommendations. We reviewed medication side effects and when to contact us when beginning new medications.

## 2018-04-27 NOTE — Telephone Encounter (Signed)
Per Dr Caryl Comes, pt is to have a GXT vs an EKG for flecainide start. Pre GXT instructions were reviewed with pt and sent to him via MyChart. Pt has verbalized understanding and had no additional questions.

## 2018-04-27 NOTE — Addendum Note (Signed)
Addended by: Dollene Primrose on: 04/27/2018 09:23 AM   Modules accepted: Orders

## 2018-04-28 ENCOUNTER — Encounter: Payer: Self-pay | Admitting: Internal Medicine

## 2018-05-03 ENCOUNTER — Ambulatory Visit: Payer: Medicare PPO

## 2018-05-05 ENCOUNTER — Ambulatory Visit: Payer: Medicare PPO | Admitting: Internal Medicine

## 2018-05-10 ENCOUNTER — Ambulatory Visit (HOSPITAL_COMMUNITY)
Admission: RE | Admit: 2018-05-10 | Discharge: 2018-05-10 | Disposition: A | Discharge status: Home / Self Care | Payer: Medicare PPO | Source: Ambulatory Visit | Attending: Internal Medicine | Admitting: Internal Medicine

## 2018-05-10 DIAGNOSIS — I472 Ventricular tachycardia, unspecified: Secondary | ICD-10-CM

## 2018-05-10 MED ORDER — GADOBUTROL 1 MMOL/ML IV SOLN
7.0000 mL | Freq: Once | INTRAVENOUS | Status: AC | PRN
  Administered 2018-05-10: 7 mL via INTRAVENOUS

## 2018-05-12 ENCOUNTER — Ambulatory Visit (INDEPENDENT_AMBULATORY_CARE_PROVIDER_SITE_OTHER): Payer: Medicare PPO

## 2018-05-12 ENCOUNTER — Ambulatory Visit: Payer: Medicare PPO | Admitting: Internal Medicine

## 2018-05-12 DIAGNOSIS — I472 Ventricular tachycardia, unspecified: Secondary | ICD-10-CM

## 2018-05-12 DIAGNOSIS — R972 Elevated prostate specific antigen [PSA]: Secondary | ICD-10-CM | POA: Diagnosis not present

## 2018-05-12 LAB — EXERCISE TOLERANCE TEST
CSEPED: 3 min
CSEPEDS: 0 s
CSEPHR: 95 %
CSEPPHR: 127 {beats}/min
Estimated workload: 6.1 METS
MPHR: 133 {beats}/min
RPE: 14
Rest HR: 86 {beats}/min

## 2018-05-14 ENCOUNTER — Telehealth: Payer: Self-pay

## 2018-05-14 MED ORDER — AMIODARONE HCL 200 MG PO TABS: ORAL_TABLET | ORAL | 0 refills | Status: DC

## 2018-05-14 NOTE — Telephone Encounter (Signed)
Called and spoke to patient. Instructed patient to stop flecainide. Made patient aware that he will need to wait 2 days prior to starting amiodarone. Patient will load his amio as instructed below. Patient verbalized understanding and thanked me for the call. Rx sent to preferred pharmacy.   Constance Haw, MD  Drue Novel I, RN        Yes, stop flecainide and start amiodarone. Can start amiodarone in 2 days after flecainide stop.    Notes recorded by Drue Novel I, RN on 05/13/2018 at 5:40 PM EDT The patient has been notified of his results.   Patient is currently taking flecainide 100 mg BID. Do we still want to switch to amiodarone? Is there a washout period? Please advise.   ------  Notes recorded by Constance Haw, MD on 05/13/2018 at 4:15 PM EDT Per prior notes from [facility], needs amiodarone 400 mg BID for 2 weeks, 200 mg BID for 2 weeks, then 200 mg daily. Needs close follow up with Caryl Comes upon his return.

## 2018-05-18 NOTE — Telephone Encounter (Signed)
lmtcb  (Per Dr. Curt Bears, ok to switch back to Flecainide, stop Amiodarone)

## 2018-05-19 ENCOUNTER — Telehealth: Payer: Self-pay | Admitting: Cardiology

## 2018-05-19 NOTE — Telephone Encounter (Signed)
[email or web address]  (Per Dr. Curt Bears, ok to switch back to Flecainide, stop Amiodarone)     FYI  I was on Flecainide Acetate from Oct. 14, 2019 through Friday, Nov. 1, 2019. I had no "spells" during this period.  Prescription was changed to Amiodarone HCL to begin Monday, Nov. 4th, following a two day (Saturday & Sunday, Nov. 2 & 3) wash. Neither medication was taken on Nov. 2nd or 3rd.  Sunday afternoon, Nov. 3, I had a "spell" of lightheadiness and chest warmth.  I started on the Amiodarone HCL on Monday, Nov. 4th, and have taken the a.m. and p.m. doses.  In the event of another "spell", what should I do?

## 2018-05-19 NOTE — Telephone Encounter (Signed)
Spoke with the patient, his spells occurred during the two day washout period. Ever since starting amiodarone he has felt fine. He wants to stay on the medication. He stated he will call if he has anymore symptoms.

## 2018-05-19 NOTE — Telephone Encounter (Signed)
New message   Patient is returning your call from my chart per the previous message .

## 2018-05-23 DIAGNOSIS — R7989 Other specified abnormal findings of blood chemistry: Secondary | ICD-10-CM

## 2018-05-23 DIAGNOSIS — E0590 Thyrotoxicosis, unspecified without thyrotoxic crisis or storm: Secondary | ICD-10-CM

## 2018-05-23 DIAGNOSIS — R945 Abnormal results of liver function studies: Secondary | ICD-10-CM

## 2018-05-23 DIAGNOSIS — R0602 Shortness of breath: Secondary | ICD-10-CM

## 2018-05-26 MED ORDER — AMIODARONE HCL 200 MG PO TABS: 200.0000 mg | ORAL_TABLET | Freq: Every day | ORAL | 3 refills | Status: DC

## 2018-05-26 NOTE — Telephone Encounter (Signed)
-----   Message from Deboraha Sprang, MD sent at 05/26/2018 10:50 AM EST ----- Reviewed  Back on amio with f/u scheduled for 1/20  Needs baseline labs TSH and LFTs  thx  And PFTs with DLCO

## 2018-05-26 NOTE — Telephone Encounter (Signed)
Spoke with pt who agrees with lab draw and DLCO/PFTs. Pt states his daughters are planning on having EKG's performed with their PCP's and pt will bring in during his next OV with SK.   We also discussed pt's amiodarone loading schedule and pt states he understands instructions. New 90 script has been sent in as well.

## 2018-05-27 ENCOUNTER — Ambulatory Visit: Payer: Medicare PPO | Admitting: Internal Medicine

## 2018-05-27 ENCOUNTER — Other Ambulatory Visit: Payer: 59 | Admitting: *Deleted

## 2018-05-27 DIAGNOSIS — R945 Abnormal results of liver function studies: Secondary | ICD-10-CM

## 2018-05-27 DIAGNOSIS — R7989 Other specified abnormal findings of blood chemistry: Secondary | ICD-10-CM

## 2018-05-27 DIAGNOSIS — E0590 Thyrotoxicosis, unspecified without thyrotoxic crisis or storm: Secondary | ICD-10-CM

## 2018-05-27 LAB — HEPATIC FUNCTION PANEL
ALT: 16 IU/L (ref 0–44)
AST: 14 IU/L (ref 0–40)
Albumin: 4.1 g/dL (ref 3.5–4.7)
Alkaline Phosphatase: 41 IU/L (ref 39–117)
BILIRUBIN TOTAL: 0.5 mg/dL (ref 0.0–1.2)
Bilirubin, Direct: 0.13 mg/dL (ref 0.00–0.40)
Total Protein: 6.4 g/dL (ref 6.0–8.5)

## 2018-05-27 LAB — TSH: TSH: 6.67 u[IU]/mL — AB (ref 0.450–4.500)

## 2018-06-17 ENCOUNTER — Ambulatory Visit (INDEPENDENT_AMBULATORY_CARE_PROVIDER_SITE_OTHER): Payer: 59 | Admitting: Internal Medicine

## 2018-06-17 DIAGNOSIS — R0602 Shortness of breath: Secondary | ICD-10-CM

## 2018-06-17 LAB — PULMONARY FUNCTION TEST
DL/VA % PRED: 55 %
DL/VA: 2.52 ml/min/mmHg/L
DLCO UNC % PRED: 57 %
DLCO unc: 17.93 ml/min/mmHg
FEF 25-75 POST: 2.38 L/s
FEF 25-75 PRE: 1.89 L/s
FEF2575-%CHANGE-POST: 25 %
FEF2575-%PRED-POST: 160 %
FEF2575-%PRED-PRE: 127 %
FEV1-%Change-Post: 4 %
FEV1-%Pred-Post: 136 %
FEV1-%Pred-Pre: 129 %
FEV1-Post: 3.28 L
FEV1-Pre: 3.13 L
FEV1FVC-%CHANGE-POST: 8 %
FEV1FVC-%Pred-Pre: 98 %
FEV6-%CHANGE-POST: -2 %
FEV6-%PRED-PRE: 138 %
FEV6-%Pred-Post: 135 %
FEV6-Post: 4.36 L
FEV6-Pre: 4.48 L
FEV6FVC-%Change-Post: 0 %
FEV6FVC-%Pred-Post: 107 %
FEV6FVC-%Pred-Pre: 107 %
FVC-%CHANGE-POST: -3 %
FVC-%PRED-POST: 125 %
FVC-%Pred-Pre: 129 %
FVC-POST: 4.39 L
FVC-PRE: 4.53 L
POST FEV1/FVC RATIO: 75 %
PRE FEV1/FVC RATIO: 69 %
Post FEV6/FVC ratio: 99 %
Pre FEV6/FVC Ratio: 99 %
RV % pred: 96 %
RV: 2.67 L
TLC % pred: 111 %
TLC: 7.66 L

## 2018-06-17 NOTE — Progress Notes (Signed)
PFT completed today.  

## 2018-06-30 DIAGNOSIS — R946 Abnormal results of thyroid function studies: Secondary | ICD-10-CM | POA: Diagnosis not present

## 2018-07-23 ENCOUNTER — Encounter: Payer: Self-pay | Admitting: Internal Medicine

## 2018-07-23 ENCOUNTER — Ambulatory Visit: Payer: Medicare PPO | Admitting: Internal Medicine

## 2018-07-23 VITALS — BP 124/62 | HR 64 | Ht 70.5 in | Wt 173.2 lb

## 2018-07-23 DIAGNOSIS — I4891 Unspecified atrial fibrillation: Secondary | ICD-10-CM | POA: Diagnosis not present

## 2018-07-23 DIAGNOSIS — I472 Ventricular tachycardia, unspecified: Secondary | ICD-10-CM

## 2018-07-23 DIAGNOSIS — I4510 Unspecified right bundle-branch block: Secondary | ICD-10-CM

## 2018-07-23 NOTE — Patient Instructions (Signed)

## 2018-07-23 NOTE — Progress Notes (Signed)
      Patient Care Team: Crist Infante, MD as PCP - General (Internal Medicine)   HPI  Jason Davies is a 85 y.o. male Seen in follow-up for exercise-induced ventricular tachycardia    No VT  Patient denies symptoms of GI intolerance, sun sensitivity, neurological symptoms attributable to amiodarone.  Surveillance laboratories were in normal limits when checked   DATE TEST EF   10/19 LHC  60 % Mod non obst CAD  10/19 cMRI 59% No LGE   Started on flecainide discontinued because of flushing spells discontinued and put on amiodarone  Date Cr K Hgb TSH LFTs  10/19 1.08 4.3 15.5 6.67 16             Records and Results Reviewed    Past Medical History:  Diagnosis Date  . Adenoma of duodenum 2002   stomach ulcer treated then, abx given, then referred to [facility] 10/2 through 2006 had serial endoscopies 9 or 10 times. Nipping away at the adenoma. It was close to a blood vessel. Got it all  . BPH (benign prostatic hyperplasia) 2001   s/p two turps  . Cataract (lens) fragments in eye following cataract surgery, bilateral   . Detached retina 1982  . GERD (gastroesophageal reflux disease)   . History of colon polyps    06/2005 due for next colon check. maybe has had some colon polyps in the past.  . Increased prostate specific antigen (PSA) velocity    12/17 5.7, was 3.8 in 2011. will recheck in 6 mos and go from there.  . Macular pucker 1984   surgery at [facility]     Past Surgical History:  Procedure Laterality Date  . LEFT HEART CATH AND CORONARY ANGIOGRAPHY N/A 04/23/2018   Procedure: LEFT HEART CATH AND CORONARY ANGIOGRAPHY;  Surgeon: Belva Crome, MD;  Location: [place] [facility];  Service: Cardiovascular;  Laterality: N/A;    Current Meds  Medication Sig  . amiodarone (PACERONE) 200 MG tablet Take 1 tablet (200 mg total) by mouth daily.  . Multiple Vitamins-Minerals (MULTIVITAMIN WITH MINERALS) tablet Take 1 tablet by mouth daily.  Marland Kitchen omeprazole (PRILOSEC) 20 MG capsule  Take 20 mg by mouth daily.    Allergies  Allergen Reactions  . Mucinex D [Pseudoephedrine-Guaifenesin Er] Other (See Comments)    Patient passes out when he takes Mucinex D      Review of Systems negative except from HPI and PMH  Physical Exam BP 124/62   Pulse 64   Ht 5' 10.5" (1.791 m)   Wt 173 lb 3.2 oz (78.6 kg)   SpO2 94%   BMI 24.50 kg/m  Well developed and nourished in no acute distress HENT normal Neck supple with JVP-flat Clear Regular rate and rhythm, no murmurs or gallops Abd-soft with active BS No Clubbing cyanosis edema Skin-warm and dry A & Oriented  Grossly normal sensory and motor function   ECG Sinus @ 64 19/14/43 RBBB  Assessment and Plan:   Ventricular tachycardia  Right bundle branch block  Sinus beats with a different QRS axis  Syncope   Hypothyroidism   No intercurrent ventricular tachycardia.  Tolerating amiodarone.  Dr. Joylene Draft is following his elevated TSH.  Reviewed side effects.  See him in 6 months.     Current medicines are reviewed at length with the patient today .  The patient does not  have concerns regarding medicines.

## 2018-08-23 DIAGNOSIS — R946 Abnormal results of thyroid function studies: Secondary | ICD-10-CM | POA: Diagnosis not present

## 2018-09-06 DIAGNOSIS — D1801 Hemangioma of skin and subcutaneous tissue: Secondary | ICD-10-CM | POA: Diagnosis not present

## 2018-09-06 DIAGNOSIS — D2361 Other benign neoplasm of skin of right upper limb, including shoulder: Secondary | ICD-10-CM | POA: Diagnosis not present

## 2018-09-06 DIAGNOSIS — L821 Other seborrheic keratosis: Secondary | ICD-10-CM | POA: Diagnosis not present

## 2018-09-06 DIAGNOSIS — Z85828 Personal history of other malignant neoplasm of skin: Secondary | ICD-10-CM | POA: Diagnosis not present

## 2018-09-06 DIAGNOSIS — L570 Actinic keratosis: Secondary | ICD-10-CM | POA: Diagnosis not present

## 2018-09-06 DIAGNOSIS — D485 Neoplasm of uncertain behavior of skin: Secondary | ICD-10-CM | POA: Diagnosis not present

## 2018-09-06 DIAGNOSIS — D225 Melanocytic nevi of trunk: Secondary | ICD-10-CM | POA: Diagnosis not present

## 2018-09-29 DIAGNOSIS — E7849 Other hyperlipidemia: Secondary | ICD-10-CM | POA: Diagnosis not present

## 2018-09-29 DIAGNOSIS — E038 Other specified hypothyroidism: Secondary | ICD-10-CM | POA: Diagnosis not present

## 2018-09-29 DIAGNOSIS — Z125 Encounter for screening for malignant neoplasm of prostate: Secondary | ICD-10-CM | POA: Diagnosis not present

## 2018-10-06 DIAGNOSIS — I4891 Unspecified atrial fibrillation: Secondary | ICD-10-CM | POA: Diagnosis not present

## 2018-10-06 DIAGNOSIS — Z8711 Personal history of peptic ulcer disease: Secondary | ICD-10-CM | POA: Diagnosis not present

## 2018-10-06 DIAGNOSIS — R82998 Other abnormal findings in urine: Secondary | ICD-10-CM | POA: Diagnosis not present

## 2018-10-06 DIAGNOSIS — R972 Elevated prostate specific antigen [PSA]: Secondary | ICD-10-CM | POA: Diagnosis not present

## 2018-10-06 DIAGNOSIS — Z0000 Encounter for general adult medical examination without abnormal findings: Secondary | ICD-10-CM | POA: Diagnosis not present

## 2018-10-06 DIAGNOSIS — D369 Benign neoplasm, unspecified site: Secondary | ICD-10-CM | POA: Diagnosis not present

## 2018-10-06 DIAGNOSIS — I4510 Unspecified right bundle-branch block: Secondary | ICD-10-CM | POA: Diagnosis not present

## 2018-10-06 DIAGNOSIS — E038 Other specified hypothyroidism: Secondary | ICD-10-CM | POA: Diagnosis not present

## 2018-10-06 DIAGNOSIS — R002 Palpitations: Secondary | ICD-10-CM | POA: Diagnosis not present

## 2018-10-06 DIAGNOSIS — E7849 Other hyperlipidemia: Secondary | ICD-10-CM | POA: Diagnosis not present

## 2018-11-10 MED ORDER — AMIODARONE HCL 200 MG PO TABS: 200.0000 mg | ORAL_TABLET | Freq: Every day | ORAL | 3 refills | Status: DC

## 2019-01-06 DIAGNOSIS — E7849 Other hyperlipidemia: Secondary | ICD-10-CM | POA: Diagnosis not present

## 2019-01-06 DIAGNOSIS — E038 Other specified hypothyroidism: Secondary | ICD-10-CM | POA: Diagnosis not present

## 2019-01-29 DIAGNOSIS — I4891 Unspecified atrial fibrillation: Secondary | ICD-10-CM | POA: Diagnosis not present

## 2019-01-29 DIAGNOSIS — E039 Hypothyroidism, unspecified: Secondary | ICD-10-CM | POA: Diagnosis not present

## 2019-01-29 DIAGNOSIS — L570 Actinic keratosis: Secondary | ICD-10-CM | POA: Diagnosis not present

## 2019-01-29 DIAGNOSIS — K219 Gastro-esophageal reflux disease without esophagitis: Secondary | ICD-10-CM | POA: Diagnosis not present

## 2019-01-29 DIAGNOSIS — Z6823 Body mass index (BMI) 23.0-23.9, adult: Secondary | ICD-10-CM | POA: Diagnosis not present

## 2019-02-08 ENCOUNTER — Telehealth: Payer: Self-pay | Admitting: Internal Medicine

## 2019-02-08 NOTE — Telephone Encounter (Signed)

## 2019-02-09 ENCOUNTER — Ambulatory Visit (INDEPENDENT_AMBULATORY_CARE_PROVIDER_SITE_OTHER): Payer: Medicare PPO | Admitting: Internal Medicine

## 2019-02-09 ENCOUNTER — Other Ambulatory Visit: Payer: Self-pay

## 2019-02-09 ENCOUNTER — Encounter: Payer: Self-pay | Admitting: Internal Medicine

## 2019-02-09 VITALS — BP 126/74 | HR 77 | Ht 70.5 in | Wt 178.4 lb

## 2019-02-09 DIAGNOSIS — I472 Ventricular tachycardia, unspecified: Secondary | ICD-10-CM

## 2019-02-09 DIAGNOSIS — I4891 Unspecified atrial fibrillation: Secondary | ICD-10-CM

## 2019-02-09 DIAGNOSIS — I4510 Unspecified right bundle-branch block: Secondary | ICD-10-CM

## 2019-02-09 MED ORDER — AMIODARONE HCL 200 MG PO TABS: ORAL_TABLET | ORAL | 3 refills | Status: DC

## 2019-02-09 NOTE — Progress Notes (Signed)
Patient Care Team: Crist Infante, MD as PCP - General (Internal Medicine)   HPI  Jason Davies is a 85 y.o. male Seen in follow-up for exercise-induced ventricular tachycardia       DATE TEST EF   10/19 LHC  60 % Mod non obst CAD  10/19 cMRI 59% No LGE   Started on flecainide discontinued because of flushing spells discontinued and put on amiodarone  Date Cr K Hgb TSH LFTs  10/19 1.08 4.3 15.5 6.67 16   6/20 1.3 4.3 14.8 2.94 22   No intercurrent VT  Patient denies symptoms of GI intolerance, sun sensitivity, neurological symptoms attributable to amiodarone.   The patient denies chest pain, shortness of breath, nocturnal dyspnea, orthopnea or peripheral edema.  There have been no palpitations, lightheadedness or syncope.    Records and Results Reviewed    Past Medical History:  Diagnosis Date  . Adenoma of duodenum 2002   stomach ulcer treated then, abx given, then referred to [facility] 10/2 through 2006 had serial endoscopies 9 or 10 times. Nipping away at the adenoma. It was close to a blood vessel. Got it all  . BPH (benign prostatic hyperplasia) 2001   s/p two turps  . Cataract (lens) fragments in eye following cataract surgery, bilateral   . Detached retina 1982  . GERD (gastroesophageal reflux disease)   . History of colon polyps    06/2005 due for next colon check. maybe has had some colon polyps in the past.  . Increased prostate specific antigen (PSA) velocity    12/17 5.7, was 3.8 in 2011. will recheck in 6 mos and go from there.  . Macular pucker 1984   surgery at [facility]     Past Surgical History:  Procedure Laterality Date  . LEFT HEART CATH AND CORONARY ANGIOGRAPHY N/A 04/23/2018   Procedure: LEFT HEART CATH AND CORONARY ANGIOGRAPHY;  Surgeon: Belva Crome, MD;  Location: [place] [facility];  Service: Cardiovascular;  Laterality: N/A;    Current Meds  Medication Sig  . amiodarone (PACERONE) 200 MG tablet Take 1 tablet (200 mg total) by mouth  daily.  Marland Kitchen levothyroxine (SYNTHROID) 50 MCG tablet Take 50 mcg by mouth daily before breakfast.  . Multiple Vitamins-Minerals (MULTIVITAMIN WITH MINERALS) tablet Take 1 tablet by mouth daily.  Marland Kitchen omeprazole (PRILOSEC) 20 MG capsule Take 20 mg by mouth daily.    Allergies  Allergen Reactions  . Mucinex D [Pseudoephedrine-Guaifenesin Er] Other (See Comments)    Patient passes out when he takes Mucinex D      Review of Systems negative except from HPI and PMH  Physical Exam BP 126/74   Pulse 77   Ht 5' 10.5" (1.791 m)   Wt 178 lb 6.4 oz (80.9 kg)   SpO2 96%   BMI 25.24 kg/m   BP 126/74   Pulse 77   Ht 5' 10.5" (1.791 m)   Wt 178 lb 6.4 oz (80.9 kg)   SpO2 96%   BMI 25.24 kg/m  Well developed and nourished in no acute distress HENT normal Neck supple with JVP-  flat   Clear Regular rate and rhythm, no murmurs or gallops Abd-soft with active BS No Clubbing cyanosis edema Skin-warm and dry A & Oriented  Grossly normal sensory and motor function  ECG sinus @ 77 20/14/40  Assessment and Plan:   Ventricular tachycardia  Right bundle branch block  Sinus beats with a different QRS axis  Syncope  Hypothyroidism   Thyroid managed by [facility]Perini  No intercurrent Ventricular tachycardia  Tolerating amio but will decrease dose 1400>>1000 mg/w       Current medicines are reviewed at length with the patient today .  The patient does not  have concerns regarding medicines.

## 2019-02-09 NOTE — Patient Instructions (Signed)
Medication Instructions:  Your physician has recommended you make the following change in your medication:   1 Decrease your Amiodarone to 200mg  tablet, 5 days per week.   Labwork: None ordered.  Testing/Procedures: None ordered.  Follow-Up: Your physician recommends that you schedule a follow-up appointment in:   6 months with Dr Caryl Comes   Any Other Special Instructions Will Be Listed Below (If Applicable).     If you need a refill on your cardiac medications before your next appointment, please call your pharmacy.

## 2019-02-23 DIAGNOSIS — Z961 Presence of intraocular lens: Secondary | ICD-10-CM | POA: Diagnosis not present

## 2019-02-23 DIAGNOSIS — H524 Presbyopia: Secondary | ICD-10-CM | POA: Diagnosis not present

## 2019-02-23 DIAGNOSIS — D3131 Benign neoplasm of right choroid: Secondary | ICD-10-CM | POA: Diagnosis not present

## 2019-02-23 DIAGNOSIS — D3132 Benign neoplasm of left choroid: Secondary | ICD-10-CM | POA: Diagnosis not present

## 2019-02-23 DIAGNOSIS — Z8669 Personal history of other diseases of the nervous system and sense organs: Secondary | ICD-10-CM | POA: Diagnosis not present

## 2019-02-23 DIAGNOSIS — H5203 Hypermetropia, bilateral: Secondary | ICD-10-CM | POA: Diagnosis not present

## 2019-02-23 DIAGNOSIS — H52203 Unspecified astigmatism, bilateral: Secondary | ICD-10-CM | POA: Diagnosis not present

## 2019-03-04 DIAGNOSIS — Z23 Encounter for immunization: Secondary | ICD-10-CM | POA: Diagnosis not present

## 2019-04-13 DIAGNOSIS — E7849 Other hyperlipidemia: Secondary | ICD-10-CM | POA: Diagnosis not present

## 2019-07-18 DIAGNOSIS — K219 Gastro-esophageal reflux disease without esophagitis: Secondary | ICD-10-CM | POA: Diagnosis not present

## 2019-07-18 DIAGNOSIS — E039 Hypothyroidism, unspecified: Secondary | ICD-10-CM | POA: Diagnosis not present

## 2019-07-18 DIAGNOSIS — L570 Actinic keratosis: Secondary | ICD-10-CM | POA: Diagnosis not present

## 2019-07-18 DIAGNOSIS — I4891 Unspecified atrial fibrillation: Secondary | ICD-10-CM | POA: Diagnosis not present

## 2019-07-18 DIAGNOSIS — Z6824 Body mass index (BMI) 24.0-24.9, adult: Secondary | ICD-10-CM | POA: Diagnosis not present

## 2019-07-26 DIAGNOSIS — E7849 Other hyperlipidemia: Secondary | ICD-10-CM | POA: Diagnosis not present

## 2019-08-04 NOTE — Progress Notes (Signed)
Cardiology Office Note Date:  08/08/2019  Patient ID:  Jerre, Morenz 02-14-1928, MRN [id number] PCP:  Crist Infante, MD  Cardiologist:  Dr. Caryl Comes    Chief Complaint: 26mo follow up   History of Present Illness: Jason Davies is a 86 y.o. male with history of BPH, GERD, hypothyroidism (follows with Dr. Joylene Draft), exercise induced VT.  He comes in today to be seen for Dr. Caryl Comes, last seen by him July 2020.  AT that time doing well, tolerating amiodarone.  His dose reduced to 100mg /week (200mg  5d/week).  His note mentions h/o syncope, though I could not tell when this occurred  He feels quite well.  No CP, palpitations or cardiac awareness of any kind.  No dizzy spells, near syncope or syncope.  He walks his dog every day 2-3 miles with good exertional capacity.  He saw Dr. Joylene Draft a couple weeks ago, has labs about every 3-4 months   AAD Hx Flecainide started Oct 2019, stopped 2/2 flushing Amiodarone started Oct 2019   Past Medical History:  Diagnosis Date  . Adenoma of duodenum 2002   stomach ulcer treated then, abx given, then referred to [facility] 10/2 through 2006 had serial endoscopies 9 or 10 times. Nipping away at the adenoma. It was close to a blood vessel. Got it all  . BPH (benign prostatic hyperplasia) 2001   s/p two turps  . Cataract (lens) fragments in eye following cataract surgery, bilateral   . Detached retina 1982  . GERD (gastroesophageal reflux disease)   . History of colon polyps    06/2005 due for next colon check. maybe has had some colon polyps in the past.  . Increased prostate specific antigen (PSA) velocity    12/17 5.7, was 3.8 in 2011. will recheck in 6 mos and go from there.  . Macular pucker 1984   surgery at [facility]     Past Surgical History:  Procedure Laterality Date  . LEFT HEART CATH AND CORONARY ANGIOGRAPHY N/A 04/23/2018   Procedure: LEFT HEART CATH AND CORONARY ANGIOGRAPHY;  Surgeon: Belva Crome, MD;  Location: [place] [facility];   Service: Cardiovascular;  Laterality: N/A;    Current Outpatient Medications  Medication Sig Dispense Refill  . amiodarone (PACERONE) 200 MG tablet Take 5 days per week. 90 tablet 3  . levothyroxine (SYNTHROID) 50 MCG tablet Take 50 mcg by mouth daily before breakfast.    . Multiple Vitamins-Minerals (MULTIVITAMIN WITH MINERALS) tablet Take 1 tablet by mouth daily.    Marland Kitchen omeprazole (PRILOSEC) 20 MG capsule Take 20 mg by mouth daily.     No current facility-administered medications for this visit.    Allergies:   Mucinex d [pseudoephedrine-guaifenesin er]   Social History:  The patient  reports that he has quit smoking. His smoking use included pipe. He started smoking about 71 years ago. He quit after 12.00 years of use. He has never used smokeless tobacco.   Family History:  The patient's family history includes Breast cancer in his sister; Other in his father and sister.  ROS:  Please see the history of present illness.    All other systems are reviewed and otherwise negative.   PHYSICAL EXAM:  VS:  BP (!) 146/68   Pulse 90   Ht 5' 10.5" (1.791 m)   Wt 181 lb (82.1 kg)   SpO2 96%   BMI 25.60 kg/m  BMI: Body mass index is 25.6 kg/m. Well nourished, well developed, in no acute distress  HEENT: normocephalic, atraumatic  Neck: no JVD, carotid bruits or masses Cardiac: RRR; no significant murmurs, no rubs, or gallops Lungs: CTA b/l, no wheezing, rhonchi or rales  Abd: soft, nontender MS: no deformity, age appropriate atrophy Ext: no edema  Skin: warm and dry, no rash Neuro:  No gross deficits appreciated Psych: euthymic mood, full affect   EKG:  Not done today  05/12/2018: ETT, negative  05/10/2018: c.MRI IMPRESSION: 1. Normal left ventricular size, thickness and systolic function (LVEF = 59%). There are no regional wall motion abnormalities. There is no late gadolinium enhancement in the left ventricular myocardium.  2. 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    04/23/2018: LHC  Variant coronary anatomy with right dominance, relatively small distribution LAD that does not recent left ventricular apex, and large first diagonal that supplies the lateral left ventricular apex.  Minimal left coronary calcification  First diagonal contains eccentric 40 to 50% ostial narrowing.  The LAD is normal.  Left main is normal  Large normal right coronary.  PDA reaches the left ventricular apex  The circumflex contains proximal 25% narrowing.  Normal left ventricular hemodynamics with LVEF 60%.  RECOMMENDATIONS:   Widely patent coronaries without critical obstructive coronary disease.  Plan will be to initiate flecainide therapy and perform cardiac MRI (per Dr. Caryl Comes).    Recent Labs: No results found for requested labs within last 8760 hours.  No results found for requested labs within last 8760 hours.   CrCl cannot be calculated (Patient's most recent lab result is older than the maximum 21 days allowed.).   Wt Readings from Last 3 Encounters:  08/08/19 181 lb (82.1 kg)  02/09/19 178 lb 6.4 oz (80.9 kg)  07/23/18 173 lb 3.2 oz (78.6 kg)     Other studies reviewed: Additional studies/records reviewed today include: summarized above  ASSESSMENT AND PLAN:  1. 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 Caryl Comes in preparation for his visit, given advanced age, no ICD  The patient mentioned Dr. Caryl Comes mentioned at his last visit with him to continue to further reduce his amio For now will make no changes and have him see Dr. Caryl Comes in 16mo  Disposition: as above  Current medicines are  reviewed at length with the patient today.  The patient did not have any concerns regarding medicines.  Venetia Night, PA-C 08/08/2019 9:16 AM     [address] [place] [address] [phone] (office)  [phone] (fax)

## 2019-08-08 ENCOUNTER — Ambulatory Visit: Payer: Medicare PPO | Admitting: Physician Assistant

## 2019-08-08 ENCOUNTER — Other Ambulatory Visit: Payer: Self-pay

## 2019-08-08 VITALS — BP 146/68 | HR 90 | Ht 70.5 in | Wt 181.0 lb

## 2019-08-08 DIAGNOSIS — I472 Ventricular tachycardia, unspecified: Secondary | ICD-10-CM

## 2019-08-08 NOTE — Patient Instructions (Signed)
Medication Instructions:  Your physician recommends that you continue on your current medications as directed. Please refer to the Current Medication list given to you today.  *If you need a refill on your cardiac medications before your next appointment, please call your pharmacy*  Lab Work: NONE ORDERED  TODAY   If you have labs (blood work) drawn today and your tests are completely normal, you will receive your results only by: . MyChart Message (if you have MyChart) OR . A paper copy in the mail If you have any lab test that is abnormal or we need to change your treatment, we will call you to review the results.  Testing/Procedures: NONE ORDERED  TODAY  Follow-Up: At [facility], you and your health needs are our priority.  As part of our continuing mission to provide you with exceptional heart care, we have created designated Provider Care Teams.  These Care Teams include your primary Cardiologist (physician) and Advanced Practice Providers (APPs -  Physician Assistants and Nurse Practitioners) who all work together to provide you with the care you need, when you need it.  Your next appointment:   6 month(s)  The format for your next appointment:   In Person  Provider:   You may see Dr. Klein  or one of the following Advanced Practice Providers on your designated Care Team:    Amber Seiler, NP  Renee Ursuy, PA-C  Michael "Andy" Tillery, PA-C   Other Instructions    

## 2019-08-24 DIAGNOSIS — H5319 Other subjective visual disturbances: Secondary | ICD-10-CM | POA: Diagnosis not present

## 2019-09-08 DIAGNOSIS — D1801 Hemangioma of skin and subcutaneous tissue: Secondary | ICD-10-CM | POA: Diagnosis not present

## 2019-09-08 DIAGNOSIS — D485 Neoplasm of uncertain behavior of skin: Secondary | ICD-10-CM | POA: Diagnosis not present

## 2019-09-08 DIAGNOSIS — D225 Melanocytic nevi of trunk: Secondary | ICD-10-CM | POA: Diagnosis not present

## 2019-09-08 DIAGNOSIS — L570 Actinic keratosis: Secondary | ICD-10-CM | POA: Diagnosis not present

## 2019-09-08 DIAGNOSIS — C44729 Squamous cell carcinoma of skin of left lower limb, including hip: Secondary | ICD-10-CM | POA: Diagnosis not present

## 2019-09-08 DIAGNOSIS — D0421 Carcinoma in situ of skin of right ear and external auricular canal: Secondary | ICD-10-CM | POA: Diagnosis not present

## 2019-09-08 DIAGNOSIS — L821 Other seborrheic keratosis: Secondary | ICD-10-CM | POA: Diagnosis not present

## 2019-09-08 DIAGNOSIS — D2262 Melanocytic nevi of left upper limb, including shoulder: Secondary | ICD-10-CM | POA: Diagnosis not present

## 2019-09-08 DIAGNOSIS — Z85828 Personal history of other malignant neoplasm of skin: Secondary | ICD-10-CM | POA: Diagnosis not present

## 2019-09-08 DIAGNOSIS — C44319 Basal cell carcinoma of skin of other parts of face: Secondary | ICD-10-CM | POA: Diagnosis not present

## 2019-09-08 DIAGNOSIS — D0422 Carcinoma in situ of skin of left ear and external auricular canal: Secondary | ICD-10-CM | POA: Diagnosis not present

## 2019-09-27 DIAGNOSIS — C44319 Basal cell carcinoma of skin of other parts of face: Secondary | ICD-10-CM | POA: Diagnosis not present

## 2019-09-27 DIAGNOSIS — Z85828 Personal history of other malignant neoplasm of skin: Secondary | ICD-10-CM | POA: Diagnosis not present

## 2019-11-07 DIAGNOSIS — E038 Other specified hypothyroidism: Secondary | ICD-10-CM | POA: Diagnosis not present

## 2019-11-07 DIAGNOSIS — E7849 Other hyperlipidemia: Secondary | ICD-10-CM | POA: Diagnosis not present

## 2019-12-07 DIAGNOSIS — E7849 Other hyperlipidemia: Secondary | ICD-10-CM | POA: Diagnosis not present

## 2019-12-07 DIAGNOSIS — Z125 Encounter for screening for malignant neoplasm of prostate: Secondary | ICD-10-CM | POA: Diagnosis not present

## 2019-12-14 DIAGNOSIS — R82998 Other abnormal findings in urine: Secondary | ICD-10-CM | POA: Diagnosis not present

## 2019-12-14 DIAGNOSIS — I472 Ventricular tachycardia: Secondary | ICD-10-CM | POA: Diagnosis not present

## 2019-12-14 DIAGNOSIS — I4510 Unspecified right bundle-branch block: Secondary | ICD-10-CM | POA: Diagnosis not present

## 2019-12-14 DIAGNOSIS — R972 Elevated prostate specific antigen [PSA]: Secondary | ICD-10-CM | POA: Diagnosis not present

## 2019-12-14 DIAGNOSIS — Z8711 Personal history of peptic ulcer disease: Secondary | ICD-10-CM | POA: Diagnosis not present

## 2019-12-14 DIAGNOSIS — D369 Benign neoplasm, unspecified site: Secondary | ICD-10-CM | POA: Diagnosis not present

## 2019-12-14 DIAGNOSIS — D6869 Other thrombophilia: Secondary | ICD-10-CM | POA: Diagnosis not present

## 2019-12-14 DIAGNOSIS — N401 Enlarged prostate with lower urinary tract symptoms: Secondary | ICD-10-CM | POA: Diagnosis not present

## 2019-12-14 DIAGNOSIS — Z0000 Encounter for general adult medical examination without abnormal findings: Secondary | ICD-10-CM | POA: Diagnosis not present

## 2019-12-14 DIAGNOSIS — E038 Other specified hypothyroidism: Secondary | ICD-10-CM | POA: Diagnosis not present

## 2019-12-15 DIAGNOSIS — Z1212 Encounter for screening for malignant neoplasm of rectum: Secondary | ICD-10-CM | POA: Diagnosis not present

## 2019-12-26 DIAGNOSIS — H6123 Impacted cerumen, bilateral: Secondary | ICD-10-CM | POA: Diagnosis not present

## 2019-12-29 DIAGNOSIS — R310 Gross hematuria: Secondary | ICD-10-CM | POA: Diagnosis not present

## 2020-02-28 DIAGNOSIS — Z03818 Encounter for observation for suspected exposure to other biological agents ruled out: Secondary | ICD-10-CM | POA: Diagnosis not present

## 2020-03-05 ENCOUNTER — Other Ambulatory Visit: Payer: Self-pay | Admitting: Internal Medicine

## 2020-03-08 DIAGNOSIS — Z85828 Personal history of other malignant neoplasm of skin: Secondary | ICD-10-CM | POA: Diagnosis not present

## 2020-03-08 DIAGNOSIS — L570 Actinic keratosis: Secondary | ICD-10-CM | POA: Diagnosis not present

## 2020-03-08 DIAGNOSIS — D225 Melanocytic nevi of trunk: Secondary | ICD-10-CM | POA: Diagnosis not present

## 2020-03-08 DIAGNOSIS — D1801 Hemangioma of skin and subcutaneous tissue: Secondary | ICD-10-CM | POA: Diagnosis not present

## 2020-03-08 DIAGNOSIS — L821 Other seborrheic keratosis: Secondary | ICD-10-CM | POA: Diagnosis not present

## 2020-03-12 DIAGNOSIS — Z961 Presence of intraocular lens: Secondary | ICD-10-CM | POA: Diagnosis not present

## 2020-03-12 DIAGNOSIS — Z8669 Personal history of other diseases of the nervous system and sense organs: Secondary | ICD-10-CM | POA: Diagnosis not present

## 2020-03-12 DIAGNOSIS — H1849 Other corneal degeneration: Secondary | ICD-10-CM | POA: Diagnosis not present

## 2020-03-12 DIAGNOSIS — D3132 Benign neoplasm of left choroid: Secondary | ICD-10-CM | POA: Diagnosis not present

## 2020-03-12 DIAGNOSIS — D3131 Benign neoplasm of right choroid: Secondary | ICD-10-CM | POA: Diagnosis not present

## 2020-04-18 DIAGNOSIS — Z23 Encounter for immunization: Secondary | ICD-10-CM | POA: Diagnosis not present

## 2020-04-24 DIAGNOSIS — E039 Hypothyroidism, unspecified: Secondary | ICD-10-CM | POA: Diagnosis not present

## 2020-04-24 DIAGNOSIS — E785 Hyperlipidemia, unspecified: Secondary | ICD-10-CM | POA: Diagnosis not present

## 2020-05-24 DIAGNOSIS — D3131 Benign neoplasm of right choroid: Secondary | ICD-10-CM | POA: Diagnosis not present

## 2020-05-24 DIAGNOSIS — H538 Other visual disturbances: Secondary | ICD-10-CM | POA: Diagnosis not present

## 2020-05-24 DIAGNOSIS — H3321 Serous retinal detachment, right eye: Secondary | ICD-10-CM | POA: Diagnosis not present

## 2020-05-24 DIAGNOSIS — D3132 Benign neoplasm of left choroid: Secondary | ICD-10-CM | POA: Diagnosis not present

## 2020-05-24 DIAGNOSIS — Z961 Presence of intraocular lens: Secondary | ICD-10-CM | POA: Diagnosis not present

## 2020-05-30 ENCOUNTER — Other Ambulatory Visit: Payer: Self-pay

## 2020-05-30 MED ORDER — AMIODARONE HCL 200 MG PO TABS: ORAL_TABLET | ORAL | 0 refills | Status: DC

## 2020-06-22 DIAGNOSIS — Q132 Other congenital malformations of iris: Secondary | ICD-10-CM | POA: Diagnosis not present

## 2020-06-22 DIAGNOSIS — Z961 Presence of intraocular lens: Secondary | ICD-10-CM | POA: Diagnosis not present

## 2020-06-22 DIAGNOSIS — H3321 Serous retinal detachment, right eye: Secondary | ICD-10-CM | POA: Diagnosis not present

## 2020-06-22 DIAGNOSIS — H538 Other visual disturbances: Secondary | ICD-10-CM | POA: Diagnosis not present

## 2020-07-25 ENCOUNTER — Other Ambulatory Visit: Payer: Self-pay

## 2020-07-25 ENCOUNTER — Encounter: Payer: Self-pay | Admitting: Cardiology

## 2020-07-25 ENCOUNTER — Ambulatory Visit: Payer: Medicare PPO | Admitting: Cardiology

## 2020-07-25 VITALS — BP 140/70 | HR 69 | Ht 70.5 in | Wt 179.0 lb

## 2020-07-25 DIAGNOSIS — I4510 Unspecified right bundle-branch block: Secondary | ICD-10-CM

## 2020-07-25 DIAGNOSIS — I472 Ventricular tachycardia, unspecified: Secondary | ICD-10-CM

## 2020-07-25 NOTE — Patient Instructions (Signed)

## 2020-07-25 NOTE — Progress Notes (Signed)
Cardiology Office Note:    Date:  07/25/2020   ID:  Jason Davies, DOB Sep 19, 1927, MRN [id number]  PCP:  Crist Infante, MD  [facility] Cardiologist:  Candee Furbish, MD  [facility] Electrophysiologist:  None   Referring MD: Crist Infante, MD     History of Present Illness:    Jason Davies is a 87 y.o. male here for new visit evaluation of ventricular tachycardia.  Exercise-induced.  Sees Dr. Caryl Comes.  Follows with Dr. Joylene Draft as well.  Tolerating amiodarone.  Dose was reduced to 100 mg/week.  No syncope.  No chest pain.  Walks his dog 2 miles a day.  Walks up and down his stairs very frequently to his office.  Labs.  LDL 130 hemoglobin 15 creatinine 1.2 potassium 4.3 ALT 28, Dr. [facility] [employer]. Met his wife there.  Former pipe smoker 50 years ago  No fhx CAD  Overall been doing quite well, no syncopal episodes.  Past Medical History:  Diagnosis Date  . Adenoma of duodenum 2002   stomach ulcer treated then, abx given, then referred to [facility] 10/2 through 2006 had serial endoscopies 9 or 10 times. Nipping away at the adenoma. It was close to a blood vessel. Got it all  . BPH (benign prostatic hyperplasia) 2001   s/p two turps  . Cataract (lens) fragments in eye following cataract surgery, bilateral   . Detached retina 1982  . GERD (gastroesophageal reflux disease)   . History of colon polyps    06/2005 due for next colon check. maybe has had some colon polyps in the past.  . Increased prostate specific antigen (PSA) velocity    12/17 5.7, was 3.8 in 2011. will recheck in 6 mos and go from there.  . Macular pucker 1984   surgery at [facility]     Past Surgical History:  Procedure Laterality Date  . LEFT HEART CATH AND CORONARY ANGIOGRAPHY N/A 04/23/2018   Procedure: LEFT HEART CATH AND CORONARY ANGIOGRAPHY;  Surgeon: Belva Crome, MD;  Location: [place] [facility];  Service: Cardiovascular;  Laterality: N/A;    Current Medications: Current Meds  Medication Sig   . amiodarone (PACERONE) 200 MG tablet TAKE 1 TABLET ONCE DAILY 5 DAYS PER WEEK. Please make yearly appt with Dr. Caryl Comes for January 2022 before anymore refills. Thank you 1st attempt  . levothyroxine (SYNTHROID) 50 MCG tablet Take 50 mcg by mouth daily before breakfast.  . Multiple Vitamins-Minerals (MULTIVITAMIN WITH MINERALS) tablet Take 1 tablet by mouth daily.  Marland Kitchen omeprazole (PRILOSEC) 20 MG capsule Take 20 mg by mouth daily.     Allergies:   Mucinex d [pseudoephedrine-guaifenesin er]   Social History   Socioeconomic History  . Marital status: Married    Spouse name: Jason Davies  . Number of children: 2  . Years of education: Not on file  . Highest education level: Not on file  Occupational History  . Not on file  Tobacco Use  . Smoking status: Former Smoker    Years: 12.00    Types: Pipe    Start date: 27  . Smokeless tobacco: Never Used  Vaping Use  . Vaping Use: Never used  Substance and Sexual Activity  . Alcohol use: Not on file  . Drug use: Not on file  . Sexual activity: Not on file  Other Topics Concern  . Not on file  Social History Narrative   [name] and [name]-childern   6 GC, 4 girls and 2 boys  all good in 2016 all good in 2017   1950 smoked a pipe from then for 12 years, never cigs   No drugs   Etoh: glass of wine or borbon one daily   Summer likes a few beers   Work: Forensic psychologist, Optician, dispensing. Estates, small businesses and real estate   Hobbies:walks daily, wrote a book in 2016 (about the civil war) letters from his grandfather who fought in the war and writing another as of 2019.   Does geneology   His grandfather fought in the civil war, he was born in [birth year].    He and his 3 brothers were all in the service.   New rescue dog 2019, part coker spaniel and part [place].       Social Determinants of Health   Financial Resource Strain: Not on file  Food Insecurity: Not on file  Transportation Needs: Not on file  Physical Activity: Not on file   Stress: Not on file  Social Connections: Not on file     Family History: The patient's family history includes Breast cancer in his sister; Other in his father and sister.  ROS:   Please see the history of present illness.     All other systems reviewed and are negative.  EKGs/Labs/Other Studies Reviewed:    The following studies were reviewed today:  Cardiac MRI 2. Normal right ventricular size, thickness with mildly decreased systolic function (LVEF = 42%). 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  3.  Normal left and right atrial size.  4. Normal size of the aortic root, ascending aorta and pulmonary artery.  5.  Mild tricuspid regurgitation.  6.  Normal pericardium.  No pericardial effusion.  IMPRESSION: 1. Normal left ventricular size, thickness and systolic function (LVEF = 59%). There are no regional wall motion abnormalities. There is no late gadolinium enhancement in the left ventricular myocardium.  2. 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  04/23/2018: LHC  Variant coronary anatomy with right dominance, relatively small distribution LAD that does not recent left ventricular apex, and large first diagonal that supplies the lateral left ventricular apex. Minimal left coronary calcification  First diagonal contains eccentric 40 to 50% ostial narrowing. The LAD is normal.  Left main is normal  Large normal right coronary. PDA reaches the left ventricular apex  The circumflex contains proximal 25% narrowing.  Normal left ventricular hemodynamics with LVEF 60%.  RECOMMENDATIONS:   Widely patent coronaries without critical obstructive coronary disease.  Plan will be to initiate flecainide therapy and perform cardiac MRI (per Dr. Caryl Comes).  Cardiac MRI  2019 - Suspicious for ARVC  EKG:  EKG is  ordered today.  The ekg ordered today demonstrates sinus rhythm 69 right bundle branch block QTC 458   Recent Labs: No results found for requested labs within last 8760 hours.  Recent Lipid Panel No results found for: CHOL, TRIG, HDL, CHOLHDL, VLDL, LDLCALC, LDLDIRECT   Risk Assessment/Calculations:       Physical Exam:    VS:  BP 140/70 (BP Location: Left Arm, Patient Position: Sitting, Cuff Size: Normal)   Pulse 69   Ht 5' 10.5" (1.791 m)   Wt 179 lb (81.2 kg)   SpO2 95%   BMI 25.32 kg/m     Wt Readings from Last 3 Encounters:  07/25/20 179 lb (  81.2 kg)  08/08/19 181 lb (82.1 kg)  02/09/19 178 lb 6.4 oz (80.9 kg)     GEN:  Well nourished, well developed in no acute distress HEENT: Normal NECK: No JVD; No carotid bruits LYMPHATICS: No lymphadenopathy CARDIAC: RRR, no murmurs, rubs, gallops RESPIRATORY:  Clear to auscultation without rales, wheezing or rhonchi  ABDOMEN: Soft, non-tender, non-distended MUSCULOSKELETAL:  No edema; No deformity  SKIN: Warm and dry NEUROLOGIC:  Alert and oriented x 3 PSYCHIATRIC:  Normal affect   ASSESSMENT:    1. Ventricular tachycardia ([place])   2. RBBB    PLAN:    In order of problems listed above:  Ventricular tachycardia,right bundle branch block - Possible ARVC on cardiac MRI.  Continue with amiodarone at the direction previously of Dr. Caryl Comes. -Overall doing doing very well without any symptoms.  No changes made.  Mildly elevated blood pressure - He will monitor periodically at home.  Slightly elevated today at 140/70. -Daily exercise, watch salt.     Medication Adjustments/Labs and Tests Ordered: Current medicines are reviewed at length with the patient today.  Concerns regarding medicines are outlined above.  Orders Placed This Encounter  Procedures  . EKG 12-Lead   No orders of the defined types were placed in this encounter.   Patient Instructions  Medication  Instructions:  The current medical regimen is effective;  continue present plan and medications.  *If you need a refill on your cardiac medications before your next appointment, please call your pharmacy*  Follow-Up: At [facility], you and your health needs are our priority.  As part of our continuing mission to provide you with exceptional heart care, we have created designated Provider Care Teams.  These Care Teams include your primary Cardiologist (physician) and Advanced Practice Providers (APPs -  Physician Assistants and Nurse Practitioners) who all work together to provide you with the care you need, when you need it.  We recommend signing up for the patient portal called "MyChart".  Sign up information is provided on this After Visit Summary.  MyChart is used to connect with patients for Virtual Visits (Telemedicine).  Patients are able to view lab/test results, encounter notes, upcoming appointments, etc.  Non-urgent messages can be sent to your provider as well.   To learn more about what you can do with MyChart, go to [email or web address].    Your next appointment:   6 month(s)  The format for your next appointment:   In Person  Provider:   Candee Furbish, MD   Thank you for choosing [facility]!!        Signed, Candee Furbish, MD  07/25/2020 11:20 AM    [place]

## 2020-08-20 DIAGNOSIS — E039 Hypothyroidism, unspecified: Secondary | ICD-10-CM | POA: Diagnosis not present

## 2020-08-20 DIAGNOSIS — E785 Hyperlipidemia, unspecified: Secondary | ICD-10-CM | POA: Diagnosis not present

## 2020-09-03 ENCOUNTER — Other Ambulatory Visit: Payer: Self-pay | Admitting: Internal Medicine

## 2020-09-05 DIAGNOSIS — M79672 Pain in left foot: Secondary | ICD-10-CM | POA: Diagnosis not present

## 2020-09-10 DIAGNOSIS — D225 Melanocytic nevi of trunk: Secondary | ICD-10-CM | POA: Diagnosis not present

## 2020-09-10 DIAGNOSIS — Z85828 Personal history of other malignant neoplasm of skin: Secondary | ICD-10-CM | POA: Diagnosis not present

## 2020-09-10 DIAGNOSIS — L821 Other seborrheic keratosis: Secondary | ICD-10-CM | POA: Diagnosis not present

## 2020-09-10 DIAGNOSIS — D1801 Hemangioma of skin and subcutaneous tissue: Secondary | ICD-10-CM | POA: Diagnosis not present

## 2020-09-10 DIAGNOSIS — L570 Actinic keratosis: Secondary | ICD-10-CM | POA: Diagnosis not present

## 2020-09-10 DIAGNOSIS — M7130 Other bursal cyst, unspecified site: Secondary | ICD-10-CM | POA: Diagnosis not present

## 2020-09-10 DIAGNOSIS — D2361 Other benign neoplasm of skin of right upper limb, including shoulder: Secondary | ICD-10-CM | POA: Diagnosis not present

## 2020-09-20 ENCOUNTER — Ambulatory Visit: Payer: Medicare PPO | Admitting: Nurse Practitioner

## 2020-09-20 ENCOUNTER — Other Ambulatory Visit: Payer: Self-pay

## 2020-09-20 VITALS — BP 132/84 | HR 89 | Ht 70.0 in | Wt 179.4 lb

## 2020-09-20 DIAGNOSIS — I472 Ventricular tachycardia, unspecified: Secondary | ICD-10-CM

## 2020-09-20 NOTE — Patient Instructions (Signed)
Medication Instructions:  Your physician recommends that you continue on your current medications as directed. Please refer to the Current Medication list given to you today.  *If you need a refill on your cardiac medications before your next appointment, please call your pharmacy*   Lab Work: None If you have labs (blood work) drawn today and your tests are completely normal, you will receive your results only by: Marland Kitchen MyChart Message (if you have MyChart) OR . A paper copy in the mail If you have any lab test that is abnormal or we need to change your treatment, we will call you to review the results.  Follow-Up: At [facility], you and your health needs are our priority.  As part of our continuing mission to provide you with exceptional heart care, we have created designated Provider Care Teams.  These Care Teams include your primary Cardiologist (physician) and Advanced Practice Providers (APPs -  Physician Assistants and Nurse Practitioners) who all work together to provide you with the care you need, when you need it.  Your next appointment:   1 year(s)  The format for your next appointment:   In Person  Provider:   You may see Dr Caryl Comes or one of the following Advanced Practice Providers on your designated Care Team:    Chanetta Marshall, NP  Tommye Standard, PA-C  [facility] [place], [place]

## 2020-09-20 NOTE — Progress Notes (Signed)
Electrophysiology Office Note Date: 09/20/2020  ID:  Bryse, Blanchette [birth year], MRN [id number]  PCP: Crist Infante, MD Primary Cardiologist: Marlou Porch Electrophysiologist: Caryl Comes  CC: follow up  Jason Davies is a 87 y.o. male seen today for Dr Caryl Comes.  He presents today for routine electrophysiology followup.  Since last being seen in our clinic, the patient reports doing very well.  He remains active walking 1.8 miles per day. He denies chest pain, palpitations, dyspnea, PND, orthopnea, nausea, vomiting, dizziness, syncope, edema, weight gain, or early satiety.  Past Medical History:  Diagnosis Date  . Adenoma of duodenum 2002   stomach ulcer treated then, abx given, then referred to [facility] 10/2 through 2006 had serial endoscopies 9 or 10 times. Nipping away at the adenoma. It was close to a blood vessel. Got it all  . BPH (benign prostatic hyperplasia) 2001   s/p two turps  . Cataract (lens) fragments in eye following cataract surgery, bilateral   . Detached retina 1982  . GERD (gastroesophageal reflux disease)   . History of colon polyps    06/2005 due for next colon check. maybe has had some colon polyps in the past.  . Increased prostate specific antigen (PSA) velocity    12/17 5.7, was 3.8 in 2011. will recheck in 6 mos and go from there.  . Macular pucker 1984   surgery at [facility]    Past Surgical History:  Procedure Laterality Date  . LEFT HEART CATH AND CORONARY ANGIOGRAPHY N/A 04/23/2018   Procedure: LEFT HEART CATH AND CORONARY ANGIOGRAPHY;  Surgeon: Belva Crome, MD;  Location: [place] [facility];  Service: Cardiovascular;  Laterality: N/A;    Current Outpatient Medications  Medication Sig Dispense Refill  . amiodarone (PACERONE) 200 MG tablet TAKE 1 TABLET ONCE DAILY 5 DAYS PER WEEK. 30 tablet 0  . levothyroxine (SYNTHROID) 50 MCG tablet Take 50 mcg by mouth daily before breakfast.    . Multiple Vitamins-Minerals (MULTIVITAMIN WITH MINERALS) tablet Take 1  tablet by mouth daily.    Marland Kitchen omeprazole (PRILOSEC) 20 MG capsule Take 20 mg by mouth daily.     No current facility-administered medications for this visit.    Allergies:   Mucinex d [pseudoephedrine-guaifenesin er]   Social History: Social History   Socioeconomic History  . Marital status: Married    Spouse name: [name]  . Number of children: 2  . Years of education: Not on file  . Highest education level: Not on file  Occupational History  . Not on file  Tobacco Use  . Smoking status: Former Smoker    Years: 12.00    Types: Pipe    Start date: 83  . Smokeless tobacco: Never Used  Vaping Use  . Vaping Use: Never used  Substance and Sexual Activity  . Alcohol use: Not on file  . Drug use: Not on file  . Sexual activity: Not on file  Other Topics Concern  . Not on file  Social History Narrative   [name] and [name]-childern   6 GC, 4 girls and 2 boys all good in 2016 all good in 2017   1950 smoked a pipe from then for 12 years, never cigs   No drugs   Etoh: glass of wine or borbon one daily   Summer likes a few beers   Work: Forensic psychologist, Optician, dispensing. Estates, small businesses and real estate   Hobbies:walks daily, wrote a book in 2016 (about the civil war) letters from his  grandfather who fought in the war and writing another as of 2019.   Does geneology   His grandfather fought in the civil war, he was born in [birth year].    He and his 3 brothers were all in the service.   New rescue dog 2019, part coker spaniel and part [place].       Social Determinants of Health   Financial Resource Strain: Not on file  Food Insecurity: Not on file  Transportation Needs: Not on file  Physical Activity: Not on file  Stress: Not on file  Social Connections: Not on file  Intimate Partner Violence: Not on file    Family History: Family History  Problem Relation Age of Onset  . Other Father        ileitis, intestinal issue, gangrene set in  . Breast cancer Sister   .  Other Sister        intestinal issue    Review of Systems: All other systems reviewed and are otherwise negative except as noted above.   Physical Exam: VS:  BP 132/84   Pulse 89   Ht 5\' 10"  (1.778 m)   Wt 179 lb 6.4 oz (81.4 kg)   SpO2 98%   BMI 25.74 kg/m  , BMI Body mass index is 25.74 kg/m. Wt Readings from Last 3 Encounters:  09/20/20 179 lb 6.4 oz (81.4 kg)  07/25/20 179 lb (81.2 kg)  08/08/19 181 lb (82.1 kg)    GEN- The patient is well appearing, alert and oriented x 3 today.   HEENT: normocephalic, atraumatic; sclera clear, conjunctiva pink; hearing intact; oropharynx clear; neck supple  Lungs- Clear to ausculation bilaterally, normal work of breathing.  No wheezes, rales, rhonchi Heart- Regular rate and rhythm, no murmurs, rubs or gallops  GI- soft, non-tender, non-distended, bowel sounds present  Extremities- no clubbing, cyanosis, or edema  MS- no significant deformity or atrophy Skin- warm and dry, no rash or lesion  Psych- euthymic mood, full affect Neuro- strength and sensation are intact   EKG:  EKG is not ordered today.  Recent Labs: No results found for requested labs within last 8760 hours.    Other studies Reviewed: Additional studies/ records that were reviewed today include: Dr Marlou Porch and Dr Olin Pia office notes   Assessment and Plan:  1.  Ventricular tachycardia No clinical recurrence Continue low dose amiodarone Labs followed by Dr Joylene Draft   Current medicines are reviewed at length with the patient today.   The patient does not have concerns regarding his medicines.  The following changes were made today:  none  Labs/ tests ordered today include: none No orders of the defined types were placed in this encounter.    Disposition:   Follow up with Dr Caryl Comes 1 year     Signed, Chanetta Marshall, NP 09/20/2020 11:16 AM   [place] [address] [phone] (office) [phone]  (fax)

## 2020-10-11 ENCOUNTER — Other Ambulatory Visit: Payer: Self-pay | Admitting: Internal Medicine

## 2020-11-02 DIAGNOSIS — H524 Presbyopia: Secondary | ICD-10-CM | POA: Diagnosis not present

## 2020-11-02 DIAGNOSIS — H52209 Unspecified astigmatism, unspecified eye: Secondary | ICD-10-CM | POA: Diagnosis not present

## 2020-11-02 DIAGNOSIS — D3132 Benign neoplasm of left choroid: Secondary | ICD-10-CM | POA: Diagnosis not present

## 2020-11-02 DIAGNOSIS — Q132 Other congenital malformations of iris: Secondary | ICD-10-CM | POA: Diagnosis not present

## 2020-11-02 DIAGNOSIS — Z961 Presence of intraocular lens: Secondary | ICD-10-CM | POA: Diagnosis not present

## 2020-11-02 DIAGNOSIS — D3131 Benign neoplasm of right choroid: Secondary | ICD-10-CM | POA: Diagnosis not present

## 2020-11-02 DIAGNOSIS — H3321 Serous retinal detachment, right eye: Secondary | ICD-10-CM | POA: Diagnosis not present

## 2020-11-02 DIAGNOSIS — H538 Other visual disturbances: Secondary | ICD-10-CM | POA: Diagnosis not present

## 2020-11-02 DIAGNOSIS — H5200 Hypermetropia, unspecified eye: Secondary | ICD-10-CM | POA: Diagnosis not present

## 2020-12-21 DIAGNOSIS — E785 Hyperlipidemia, unspecified: Secondary | ICD-10-CM | POA: Diagnosis not present

## 2020-12-21 DIAGNOSIS — Z125 Encounter for screening for malignant neoplasm of prostate: Secondary | ICD-10-CM | POA: Diagnosis not present

## 2020-12-21 DIAGNOSIS — E039 Hypothyroidism, unspecified: Secondary | ICD-10-CM | POA: Diagnosis not present

## 2020-12-21 DIAGNOSIS — Z0000 Encounter for general adult medical examination without abnormal findings: Secondary | ICD-10-CM | POA: Diagnosis not present

## 2020-12-28 DIAGNOSIS — Z1331 Encounter for screening for depression: Secondary | ICD-10-CM | POA: Diagnosis not present

## 2020-12-28 DIAGNOSIS — Z1389 Encounter for screening for other disorder: Secondary | ICD-10-CM | POA: Diagnosis not present

## 2020-12-28 DIAGNOSIS — Z0000 Encounter for general adult medical examination without abnormal findings: Secondary | ICD-10-CM | POA: Diagnosis not present

## 2020-12-28 DIAGNOSIS — E039 Hypothyroidism, unspecified: Secondary | ICD-10-CM | POA: Diagnosis not present

## 2020-12-28 DIAGNOSIS — N401 Enlarged prostate with lower urinary tract symptoms: Secondary | ICD-10-CM | POA: Diagnosis not present

## 2020-12-28 DIAGNOSIS — N1831 Chronic kidney disease, stage 3a: Secondary | ICD-10-CM | POA: Diagnosis not present

## 2020-12-28 DIAGNOSIS — D6869 Other thrombophilia: Secondary | ICD-10-CM | POA: Diagnosis not present

## 2020-12-28 DIAGNOSIS — R82998 Other abnormal findings in urine: Secondary | ICD-10-CM | POA: Diagnosis not present

## 2020-12-28 DIAGNOSIS — I472 Ventricular tachycardia: Secondary | ICD-10-CM | POA: Diagnosis not present

## 2020-12-28 DIAGNOSIS — E785 Hyperlipidemia, unspecified: Secondary | ICD-10-CM | POA: Diagnosis not present

## 2021-01-01 DIAGNOSIS — Z23 Encounter for immunization: Secondary | ICD-10-CM | POA: Diagnosis not present

## 2021-02-13 DIAGNOSIS — D3132 Benign neoplasm of left choroid: Secondary | ICD-10-CM | POA: Diagnosis not present

## 2021-02-13 DIAGNOSIS — H43813 Vitreous degeneration, bilateral: Secondary | ICD-10-CM | POA: Diagnosis not present

## 2021-02-13 DIAGNOSIS — H43392 Other vitreous opacities, left eye: Secondary | ICD-10-CM | POA: Diagnosis not present

## 2021-04-01 DIAGNOSIS — E039 Hypothyroidism, unspecified: Secondary | ICD-10-CM | POA: Diagnosis not present

## 2021-04-01 DIAGNOSIS — L821 Other seborrheic keratosis: Secondary | ICD-10-CM | POA: Diagnosis not present

## 2021-04-01 DIAGNOSIS — Z85828 Personal history of other malignant neoplasm of skin: Secondary | ICD-10-CM | POA: Diagnosis not present

## 2021-04-01 DIAGNOSIS — D225 Melanocytic nevi of trunk: Secondary | ICD-10-CM | POA: Diagnosis not present

## 2021-04-01 DIAGNOSIS — E785 Hyperlipidemia, unspecified: Secondary | ICD-10-CM | POA: Diagnosis not present

## 2021-04-01 DIAGNOSIS — C44319 Basal cell carcinoma of skin of other parts of face: Secondary | ICD-10-CM | POA: Diagnosis not present

## 2021-04-01 DIAGNOSIS — D485 Neoplasm of uncertain behavior of skin: Secondary | ICD-10-CM | POA: Diagnosis not present

## 2021-04-04 ENCOUNTER — Ambulatory Visit: Payer: Medicare PPO | Admitting: Cardiology

## 2021-04-15 NOTE — Progress Notes (Signed)
Cardiology Office Note:    Date:  04/15/2021   ID:  Jason Davies, DOB 1928/07/13, MRN [id number]  PCP:  Rodrigo Ran, MD   [facility] Providers Cardiologist:  Donato Schultz, MD      Referring MD: Rodrigo Ran, MD   Follow-up for ventricular tachycardia and RBBB  History of Present Illness:    Jason Davies is a 87 y.o. male with a hx of GERD, RBBB, ventricular tachycardia, BPH, former tobacco use and adenoma of duodenum.  He was seen by Dr. Anne Fu on 07/25/2020.  During that time he was seen for follow-up of his ventricular tachycardia which was exercise-induced.  He was tolerating his amiodarone which was dosed at 100 mg/week.  He denied syncope, chest discomfort.  He was walking his dog 2 miles per day and reported going up and down stairs frequently at his office.  He is a Scientist, research (medical) and met his wife at [facility].  He has no history of coronary artery disease.  Overall he is doing well from a cardiac standpoint and denied syncopal episodes.  He presents to the clinic today for follow-up evaluation and states he feels well.  He continues to manage his [employer] down in [place], [place].  The plan he was given to him by his father and he reports that in the 1950s they converted most of the round from tillable ground into timber ground.  He now has sections of the property of logged into the intervals.  He looks forward to handing property down to his children.  He stays physically active walking his dogs.  He does notice increased work of breathing with increased physical activity but has normal breathing with normal activities.  He denies chest pain.  We will continue his current medications and plan follow-up for 9 to 12 months.  Today he denies chest pain, shortness of breath, lower extremity edema, fatigue, palpitations, melena, hematuria, hemoptysis, weakness, presyncope, and syncope.   Past Medical History:  Diagnosis Date   Adenoma of duodenum 2002   stomach  ulcer treated then, abx given, then referred to [facility] 10/2 through 2006 had serial endoscopies 9 or 10 times. Nipping away at the adenoma. It was close to a blood vessel. Got it all   BPH (benign prostatic hyperplasia) 2001   s/p two turps   Cataract (lens) fragments in eye following cataract surgery, bilateral    Detached retina 1982   GERD (gastroesophageal reflux disease)    History of colon polyps    06/2005 due for next colon check. maybe has had some colon polyps in the past.   Increased prostate specific antigen (PSA) velocity    12/17 5.7, was 3.8 in 2011. will recheck in 6 mos and go from there.   Macular pucker 1984   surgery at [facility]     Past Surgical History:  Procedure Laterality Date   LEFT HEART CATH AND CORONARY ANGIOGRAPHY N/A 04/23/2018   Procedure: LEFT HEART CATH AND CORONARY ANGIOGRAPHY;  Surgeon: Lyn Records, MD;  Location: [facility] INVASIVE CV LAB;  Service: Cardiovascular;  Laterality: N/A;    Current Medications: No outpatient medications have been marked as taking for the 04/17/21 encounter (Appointment) with Ronney Asters, NP.     Allergies:   Mucinex d [pseudoephedrine-guaifenesin er]   Social History   Socioeconomic History   Marital status: Married    Spouse name: Okey Regal   Number of children: 2   Years of education: Not on file  Highest education level: Not on file  Occupational History   Not on file  Tobacco Use   Smoking status: Former    Types: Pipe    Start date: 1950   Smokeless tobacco: Never  Vaping Use   Vaping Use: Never used  Substance and Sexual Activity   Alcohol use: Not on file   Drug use: Not on file   Sexual activity: Not on file  Other Topics Concern   Not on file  Social History Narrative   [name] and [name]-childern   6 GC, 4 girls and 2 boys all good in 2016 all good in 2017   1950 smoked a pipe from then for 12 years, never cigs   No drugs   Etoh: glass of wine or borbon one daily   Summer likes a few beers    Work: Forensic psychologist, Optician, dispensing. Estates, small businesses and real estate   Hobbies:walks daily, wrote a book in 2016 (about the civil war) letters from his grandfather who fought in the war and writing another as of 2019.   Does geneology   His grandfather fought in the civil war, he was born in [birth year].    He and his 3 brothers were all in the service.   New rescue dog 2019, part coker spaniel and part [place].       Social Determinants of Health   Financial Resource Strain: Not on file  Food Insecurity: Not on file  Transportation Needs: Not on file  Physical Activity: Not on file  Stress: Not on file  Social Connections: Not on file     Family History: The patient's family history includes Breast cancer in his sister; Other in his father and sister.  ROS:   Please see the history of present illness.     All other systems reviewed and are negative.   Risk Assessment/Calculations:           Physical Exam:    VS:  There were no vitals taken for this visit.    Wt Readings from Last 3 Encounters:  09/20/20 179 lb 6.4 oz (81.4 kg)  07/25/20 179 lb (81.2 kg)  08/08/19 181 lb (82.1 kg)     GEN:  Well nourished, well developed in no acute distress HEENT: Normal NECK: No JVD; No carotid bruits LYMPHATICS: No lymphadenopathy CARDIAC: RRR, no murmurs, rubs, gallops RESPIRATORY:  Clear to auscultation without rales, wheezing or rhonchi  ABDOMEN: Soft, non-tender, non-distended MUSCULOSKELETAL:  No edema; No deformity  SKIN: Warm and dry NEUROLOGIC:  Alert and oriented x 3 PSYCHIATRIC:  Normal affect    EKGs/Labs/Other Studies Reviewed:    The following studies were reviewed today: ETT 06/09/2018  ECG Baseline ECG exhibits normal sinus rhythm..  Stress Findings The patient exercised following a manual protocol.   The patient reported no symptoms during the stress test. The patient experienced no angina during the stress test.   Test was stopped per protocol.     Blood pressure and heart rate demonstrated a normal response to exercise. Blood pressure demonstrated a normal response to exercise.   Recovery time:  5 minutes.  The patient's response to exercise was adequate for diagnosis. No induced arrhythmias   Baseline Vitals  Rest HR 86 bpm      Rest BP 134/82 mmHg      Exercise Time  Exercise duration (min) 3 min      Exercise duration (sec) 0 sec      Peak Stress Vitals  Peak  HR 127 bpm      Peak BP 198/80 mmHg      Exercise Data  MPHR 133 bpm      Percent HR 95 %      RPE 14       Estimated workload 6.1 METS      Cardiac MRI 2. Normal right ventricular size, thickness with mildly decreased systolic function (LVEF = 42%). 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   3.  Normal left and right atrial size.   4. Normal size of the aortic root, ascending aorta and pulmonary artery.   5.  Mild tricuspid regurgitation.   6.  Normal pericardium.  No pericardial effusion.   IMPRESSION: 1. Normal left ventricular size, thickness and systolic function (LVEF = 59%). There are no regional wall motion abnormalities. There is no late gadolinium enhancement in the left ventricular myocardium.   2. 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  Cardiac catheterization 04/23/2018  Variant coronary anatomy with right dominance, relatively small distribution LAD that does not recent left ventricular apex, and large first diagonal that supplies the lateral left ventricular apex.  Minimal left coronary calcification First diagonal contains eccentric 40 to 50% ostial narrowing.  The LAD is normal. Left main is normal Large normal right coronary.  PDA reaches the left ventricular apex The circumflex contains proximal 25% narrowing. Normal left  ventricular hemodynamics with LVEF 60%.   RECOMMENDATIONS:   Widely patent coronaries without critical obstructive coronary disease. Plan will be to initiate flecainide therapy and perform cardiac MRI (per Dr. Caryl Comes).   No indication for antiplatelet therapy at this time.  Diagnostic Dominance: Right Intervention     EKG:  EKG is  ordered today.  The ekg ordered today demonstrates normal sinus rhythm left axis deviation right bundle branch block 78 bpm  Recent Labs: No results found for requested labs within last 8760 hours.  Recent Lipid Panel No results found for: CHOL, TRIG, HDL, CHOLHDL, VLDL, LDLCALC, LDLDIRECT  ASSESSMENT & PLAN    Ventricular tachycardia-denies recent episodes of accelerated heart rates or palpitations.  Denies presyncope and syncope.  Known RBBB.  Also follows with Dr. Caryl Comes. Continue amiodarone Heart healthy low-sodium diet-salty 6 given Increase physical activity as tolerated  Essential hypertension-BP today 115/65.  Well-controlled at home. Continue to monitor. Heart healthy low-sodium diet-salty 6 given Increase physical activity as tolerated-goal 150 minutes of moderate aerobic physical activity per week.   Disposition: Follow-up with Dr. Marlou Porch in 9-12 months.       Medication Adjustments/Labs and Tests Ordered: Current medicines are reviewed at length with the patient today.  Concerns regarding medicines are outlined above.  No orders of the defined types were placed in this encounter.  No orders of the defined types were placed in this encounter.   There are no Patient Instructions on file for this visit.   Signed, Deberah Pelton, NP  04/15/2021 6:40 AM      Notice: This dictation was prepared with Dragon dictation along with smaller phrase technology. Any transcriptional errors that result from this process are unintentional and may not be corrected upon review.  I spent 14 minutes examining this patient, reviewing  medications, and using patient centered shared decision making involving her cardiac care.  Prior to her visit I spent greater than 20 minutes reviewing  her past medical history,  medications, and prior cardiac tests.

## 2021-04-17 ENCOUNTER — Ambulatory Visit (HOSPITAL_BASED_OUTPATIENT_CLINIC_OR_DEPARTMENT_OTHER): Payer: Medicare PPO | Admitting: General Practice

## 2021-04-17 ENCOUNTER — Other Ambulatory Visit: Payer: Self-pay

## 2021-04-17 ENCOUNTER — Encounter (HOSPITAL_BASED_OUTPATIENT_CLINIC_OR_DEPARTMENT_OTHER): Payer: Self-pay | Admitting: General Practice

## 2021-04-17 VITALS — BP 115/65 | HR 78 | Ht 70.5 in | Wt 179.2 lb

## 2021-04-17 DIAGNOSIS — I10 Essential (primary) hypertension: Secondary | ICD-10-CM | POA: Diagnosis not present

## 2021-04-17 DIAGNOSIS — I472 Ventricular tachycardia, unspecified: Secondary | ICD-10-CM | POA: Diagnosis not present

## 2021-04-17 NOTE — Patient Instructions (Signed)
Medication Instructions:  Your physician recommends that you continue on your current medications as directed. Please refer to the Current Medication list given to you today.  *If you need a refill on your cardiac medications before your next appointment, please call your pharmacy*  Follow-Up: At [facility], you and your health needs are our priority.  As part of our continuing mission to provide you with exceptional heart care, we have created designated Provider Care Teams.  These Care Teams include your primary Cardiologist (physician) and Advanced Practice Providers (APPs -  Physician Assistants and Nurse Practitioners) who all work together to provide you with the care you need, when you need it.  We recommend signing up for the patient portal called "MyChart".  Sign up information is provided on this After Visit Summary.  MyChart is used to connect with patients for Virtual Visits (Telemedicine).  Patients are able to view lab/test results, encounter notes, upcoming appointments, etc.  Non-urgent messages can be sent to your provider as well.   To learn more about what you can do with MyChart, go to [email or web address].    Your next appointment:   9-12 month(s)  The format for your next appointment:   In Person  Provider:   You may see Candee Furbish, MD or one of the following Advanced Practice Providers on your designated Care Team:   Cecilie Kicks, NP Coletta Memos, NP   Other Instructions Coletta Memos, NP has recommended maintaining your current physical activity and following a low sodium diet.

## 2021-04-20 DIAGNOSIS — Z23 Encounter for immunization: Secondary | ICD-10-CM | POA: Diagnosis not present

## 2021-05-08 DIAGNOSIS — L988 Other specified disorders of the skin and subcutaneous tissue: Secondary | ICD-10-CM | POA: Diagnosis not present

## 2021-05-08 DIAGNOSIS — D485 Neoplasm of uncertain behavior of skin: Secondary | ICD-10-CM | POA: Diagnosis not present

## 2021-05-08 DIAGNOSIS — Z85828 Personal history of other malignant neoplasm of skin: Secondary | ICD-10-CM | POA: Diagnosis not present

## 2021-05-21 DIAGNOSIS — Z23 Encounter for immunization: Secondary | ICD-10-CM | POA: Diagnosis not present

## 2021-06-12 DIAGNOSIS — H31003 Unspecified chorioretinal scars, bilateral: Secondary | ICD-10-CM | POA: Diagnosis not present

## 2021-06-12 DIAGNOSIS — H5203 Hypermetropia, bilateral: Secondary | ICD-10-CM | POA: Diagnosis not present

## 2021-06-12 DIAGNOSIS — H52203 Unspecified astigmatism, bilateral: Secondary | ICD-10-CM | POA: Diagnosis not present

## 2021-08-07 DIAGNOSIS — E039 Hypothyroidism, unspecified: Secondary | ICD-10-CM | POA: Diagnosis not present

## 2021-08-07 DIAGNOSIS — E785 Hyperlipidemia, unspecified: Secondary | ICD-10-CM | POA: Diagnosis not present

## 2021-10-21 ENCOUNTER — Other Ambulatory Visit: Payer: Self-pay | Admitting: Internal Medicine

## 2021-10-22 ENCOUNTER — Ambulatory Visit: Payer: Medicare PPO | Admitting: Internal Medicine

## 2021-10-22 ENCOUNTER — Encounter: Payer: Self-pay | Admitting: Internal Medicine

## 2021-10-22 VITALS — BP 122/70 | HR 73 | Ht 70.0 in | Wt 178.0 lb

## 2021-10-22 DIAGNOSIS — I472 Ventricular tachycardia, unspecified: Secondary | ICD-10-CM | POA: Diagnosis not present

## 2021-10-22 MED ORDER — AMIODARONE HCL 100 MG PO TABS: 100.0000 mg | ORAL_TABLET | Freq: Every day | ORAL | 3 refills | Status: DC

## 2021-10-22 NOTE — Patient Instructions (Addendum)
Medication Instructions:  ?Your physician has recommended you make the following change in your medication:  ? ?** Stop Amiodarone '200mg'$   ? ?** Begin Amiodarone '100mg'$  - 1 tablet by mouth daily ? ?*If you need a refill on your cardiac medications before your next appointment, please call your pharmacy* ? ? ?Lab Work: ?None ordered. ? ?If you have labs (blood work) drawn today and your tests are completely normal, you will receive your results only by: ?MyChart Message (if you have MyChart) OR ?A paper copy in the mail ?If you have any lab test that is abnormal or we need to change your treatment, we will call you to review the results. ? ? ?Testing/Procedures: ?None ordered. ? ? ? ?Follow-Up: ?At [facility], you and your health needs are our priority.  As part of our continuing mission to provide you with exceptional heart care, we have created designated Provider Care Teams.  These Care Teams include your primary Cardiologist (physician) and Advanced Practice Providers (APPs -  Physician Assistants and Nurse Practitioners) who all work together to provide you with the care you need, when you need it. ? ?We recommend signing up for the patient portal called "MyChart".  Sign up information is provided on this After Visit Summary.  MyChart is used to connect with patients for Virtual Visits (Telemedicine).  Patients are able to view lab/test results, encounter notes, upcoming appointments, etc.  Non-urgent messages can be sent to your provider as well.   ?To learn more about what you can do with MyChart, go to [email or web address].   ? ?Your next appointment:   ?12 months with Dr Caryl Comes ? ?Important Information About Sugar ? ? ? ? ?  ?

## 2021-10-22 NOTE — Progress Notes (Signed)
? ? ? ? ?Patient Care Team: ?Crist Infante, MD as PCP - General (Internal Medicine) ?Jerline Pain, MD as PCP - Cardiology (Cardiology) ? ? ?HPI ? ?Jason Davies is a 88 y.o. male ?Seen in follow-up for exercise-induced ventricular tachycardia   ? ?The patient denies chest pain, shortness of breath, nocturnal dyspnea, orthopnea or peripheral edema.  There have been no palpitations, lightheadedness or syncope.   ? ?Patient denies symptoms of respiratory, GI intolerance, sun sensitivity, neurological symptoms attributable to amiodarone.      ? ?No VT  ?  ? ?DATE TEST EF   ?10/19 LHC  60 % Mod non obst CAD  ?10/19 cMRI 59% No LGE  ?  ?Date Cr K Hgb TSH LFTs  ?10/19 1.08 4.3 15.5 6.67 16  ? 6/20 1.3 4.3 14.8 2.94 22  ?9/22 1.2 5.0 15.3 3.69   ? ?  ? ?  ? ?Records and Results Reviewed   ? ?Past Medical History:  ?Diagnosis Date  ? Adenoma of duodenum 2002  ? stomach ulcer treated then, abx given, then referred to [facility] 10/2 through 2006 had serial endoscopies 9 or 10 times. Nipping away at the adenoma. It was close to a blood vessel. Got it all  ? BPH (benign prostatic hyperplasia) 2001  ? s/p two turps  ? Cataract (lens) fragments in eye following cataract surgery, bilateral   ? Detached retina 1982  ? GERD (gastroesophageal reflux disease)   ? History of colon polyps   ? 06/2005 due for next colon check. maybe has had some colon polyps in the past.  ? Increased prostate specific antigen (PSA) velocity   ? 12/17 5.7, was 3.8 in 2011. will recheck in 6 mos and Davies from there.  ? Macular pucker 1984  ? surgery at [place]   ? ? ?Past Surgical History:  ?Procedure Laterality Date  ? LEFT HEART CATH AND CORONARY ANGIOGRAPHY N/A 04/23/2018  ? Procedure: LEFT HEART CATH AND CORONARY ANGIOGRAPHY;  Surgeon: Belva Crome, MD;  Location: [place] CV [facility];  Service: Cardiovascular;  Laterality: N/A;  ? ? ?Current Meds  ?Medication Sig  ? amiodarone (PACERONE) 200 MG tablet TAKE 1 TABLET ONCE DAILY 5 DAYS PER WEEK.  ?  levothyroxine (SYNTHROID) 50 MCG tablet Take 50 mcg by mouth daily before breakfast.  ? Multiple Vitamins-Minerals (MULTIVITAMIN WITH MINERALS) tablet Take 1 tablet by mouth daily.  ? omeprazole (PRILOSEC) 20 MG capsule Take 20 mg by mouth daily.  ? ? ?Allergies  ?Allergen Reactions  ? Mucinex D [Pseudoephedrine-Guaifenesin Er] Other (See Comments)  ?  Patient passes out when he takes Mucinex D  ? ? ? ? ?Review of Systems negative except from HPI and PMH ? ?Physical Exam ?BP 122/70   Pulse 73   Ht '5\' 10"'$  (1.778 m)   Wt 178 lb (80.7 kg)   SpO2 95%   BMI 25.54 kg/m?   ?Well developed and well nourished in no acute distress ?HENT normal ?Neck supple with JVP-flat ?Clear ?Device pocket well healed; without hematoma or erythema.  There is no tethering  ?Regular rate and rhythm, no  murmur ?Abd-soft with active BS ?No Clubbing cyanosis \ edema ?Skin-warm and dry ?A & Oriented  Grossly normal sensory and motor function ? ?ECG    ? ?Assessment and Plan:  ?  ?Ventricular tachycardia ?  ?Right bundle branch block ?  ?Sinus beats with a different QRS axis ?  ?Syncope  ? ?Hypothyroidism ? ?No intercurrent VT ? ?Continue  Amio but decrease from '1000mg'$ /week to 700 mg/week, ie 100 mg daily ? ?No syncope ? ?Amio labs sought from PCP office ? ?They will fax ? ?  ? ? ?Current medicines are reviewed at length with the patient today .  The patient does not  have concerns regarding medicines. ? ?

## 2021-12-02 DIAGNOSIS — E039 Hypothyroidism, unspecified: Secondary | ICD-10-CM | POA: Diagnosis not present

## 2021-12-02 DIAGNOSIS — E785 Hyperlipidemia, unspecified: Secondary | ICD-10-CM | POA: Diagnosis not present

## 2021-12-17 DIAGNOSIS — H31091 Other chorioretinal scars, right eye: Secondary | ICD-10-CM | POA: Diagnosis not present

## 2021-12-17 DIAGNOSIS — H35372 Puckering of macula, left eye: Secondary | ICD-10-CM | POA: Diagnosis not present

## 2021-12-17 DIAGNOSIS — H43812 Vitreous degeneration, left eye: Secondary | ICD-10-CM | POA: Diagnosis not present

## 2021-12-17 DIAGNOSIS — D3132 Benign neoplasm of left choroid: Secondary | ICD-10-CM | POA: Diagnosis not present

## 2022-01-09 ENCOUNTER — Other Ambulatory Visit: Payer: Self-pay

## 2022-01-09 ENCOUNTER — Encounter (HOSPITAL_BASED_OUTPATIENT_CLINIC_OR_DEPARTMENT_OTHER): Payer: Self-pay | Admitting: Emergency Medicine

## 2022-01-09 ENCOUNTER — Emergency Department (HOSPITAL_BASED_OUTPATIENT_CLINIC_OR_DEPARTMENT_OTHER)
Admission: EM | Admit: 2022-01-09 | Discharge: 2022-01-09 | Disposition: A | Discharge status: Home / Self Care | Payer: Medicare PPO | Attending: Emergency Medicine | Admitting: Emergency Medicine

## 2022-01-09 ENCOUNTER — Emergency Department (HOSPITAL_BASED_OUTPATIENT_CLINIC_OR_DEPARTMENT_OTHER): Payer: Medicare PPO

## 2022-01-09 ENCOUNTER — Emergency Department (HOSPITAL_BASED_OUTPATIENT_CLINIC_OR_DEPARTMENT_OTHER): Payer: Medicare PPO | Admitting: Radiology

## 2022-01-09 DIAGNOSIS — S32030A Wedge compression fracture of third lumbar vertebra, initial encounter for closed fracture: Secondary | ICD-10-CM | POA: Diagnosis not present

## 2022-01-09 DIAGNOSIS — H1132 Conjunctival hemorrhage, left eye: Secondary | ICD-10-CM | POA: Diagnosis not present

## 2022-01-09 DIAGNOSIS — W19XXXA Unspecified fall, initial encounter: Secondary | ICD-10-CM

## 2022-01-09 DIAGNOSIS — S0990XA Unspecified injury of head, initial encounter: Secondary | ICD-10-CM | POA: Diagnosis not present

## 2022-01-09 DIAGNOSIS — W010XXA Fall on same level from slipping, tripping and stumbling without subsequent striking against object, initial encounter: Secondary | ICD-10-CM | POA: Insufficient documentation

## 2022-01-09 DIAGNOSIS — Z043 Encounter for examination and observation following other accident: Secondary | ICD-10-CM | POA: Diagnosis not present

## 2022-01-09 DIAGNOSIS — S20211A Contusion of right front wall of thorax, initial encounter: Secondary | ICD-10-CM | POA: Insufficient documentation

## 2022-01-09 DIAGNOSIS — R519 Headache, unspecified: Secondary | ICD-10-CM | POA: Diagnosis not present

## 2022-01-09 DIAGNOSIS — M545 Low back pain, unspecified: Secondary | ICD-10-CM | POA: Diagnosis not present

## 2022-01-09 DIAGNOSIS — S3992XA Unspecified injury of lower back, initial encounter: Secondary | ICD-10-CM | POA: Diagnosis present

## 2022-01-09 NOTE — ED Provider Notes (Signed)
[facility] Provider Note   CSN: [id number] Arrival date & time: 01/09/22  1538     History  Chief Complaint  Patient presents with   Jason Davies is a 88 y.o. male.  HPI     88yo male with history below presents with concern for fall with right chest wall pain and redness to left eye.  Reports he was walking his dog when he tripped and fell this morning around 830AM. It happened so quickly and he didn't notice he hit his head but had bleeding from hs face and notes heprobably did.  A woman helped him up and notified him he was bleeding. He was able to walk home Has pain 5/10 to right side of chest.  Reports it feels like a bruise, is mild, worse with movements. Can take deep breaths and does not feel shortness of breath. No fever, cough, recent illness. Had a headache prior to the fall and took tyelnol this AM.. Has chronic back pain and it does not feel different to him No numbness/weakness/change in vision/trouble walking or talking. No lower extrmity pain.   He looked in mirror while he was shaving and noticed left eye was bloodshot.  Has hx of retinal detachemnt and became concerned. No flashes, floaters or visual changes. No eye pain.  Not on anticoagulation.  Past Medical History:  Diagnosis Date   Adenoma of duodenum 2002   stomach ulcer treated then, abx given, then referred to [facility] 10/2 through 2006 had serial endoscopies 9 or 10 times. Nipping away at the adenoma. It was close to a blood vessel. Got it all   BPH (benign prostatic hyperplasia) 2001   s/p two turps   Cataract (lens) fragments in eye following cataract surgery, bilateral    Detached retina 1982   GERD (gastroesophageal reflux disease)    History of colon polyps    06/2005 due for next colon check. maybe has had some colon polyps in the past.   Increased prostate specific antigen (PSA) velocity    12/17 5.7, was 3.8 in 2011. will recheck in 6 mos and go from there.    Macular pucker 1984   surgery at [facility] Medications Prior to Admission medications   Medication Sig Start Date End Date Taking? Authorizing Provider  amiodarone (PACERONE) 100 MG tablet Take 1 tablet (100 mg total) by mouth daily. 10/22/21   Deboraha Sprang, MD  levothyroxine (SYNTHROID) 50 MCG tablet Take 50 mcg by mouth daily before breakfast.    [provider]  Multiple Vitamins-Minerals (MULTIVITAMIN WITH MINERALS) tablet Take 1 tablet by mouth daily.    [provider]  omeprazole (PRILOSEC) 20 MG capsule Take 20 mg by mouth daily.    [provider]      Allergies    Mucinex d [pseudoephedrine-guaifenesin er]    Review of Systems   Review of Systems  Physical Exam Updated Vital Signs BP (!) 147/98 (BP Location: Right Arm)   Pulse 71   Temp 98.3 F (36.8 C) (Oral)   Resp 18   SpO2 97%  Physical Exam Vitals and nursing note reviewed.  Constitutional:      General: He is not in acute distress.    Appearance: He is well-developed. He is not diaphoretic.  HENT:     Head: Normocephalic and atraumatic.  Eyes:     Extraocular Movements: Extraocular movements intact.     Conjunctiva/sclera: Conjunctivae normal.  Pupils: Pupils are equal, round, and reactive to light.     Comments: Subconjunctival hemorrhage left eye Normal eom, normal pupils, no hyphema, normal shape  Cardiovascular:     Rate and Rhythm: Normal rate and regular rhythm.     Pulses: Normal pulses.     Heart sounds: Normal heart sounds. No murmur heard.    No friction rub. No gallop.  Pulmonary:     Effort: Pulmonary effort is normal. No respiratory distress.     Breath sounds: Normal breath sounds. No wheezing or rales.  Chest:     Chest wall: Tenderness (mild bruise, tenderness right anterior chest wal) present.  Abdominal:     General: There is no distension.     Palpations: Abdomen is soft.     Tenderness: There is no abdominal tenderness. There is no guarding.   Musculoskeletal:        General: Tenderness (umbar spine, reports unchanged) present.     Cervical back: Normal range of motion.  Skin:    General: Skin is warm and dry.  Neurological:     Mental Status: He is alert and oriented to person, place, and time.     Sensory: No sensory deficit.     Motor: No weakness.     ED Results / Procedures / Treatments   Labs (all labs ordered are listed, but only abnormal results are displayed) Labs Reviewed - No data to display  EKG None  Radiology CT Head Wo Contrast  Result Date: 01/09/2022 CLINICAL DATA:  Blunt facial trauma after a fall. EXAM: CT HEAD WITHOUT CONTRAST TECHNIQUE: Contiguous axial images were obtained from the base of the skull through the vertex without intravenous contrast. RADIATION DOSE REDUCTION: This exam was performed according to the departmental dose-optimization program which includes automated exposure control, adjustment of the mA and/or kV according to patient size and/or use of iterative reconstruction technique. COMPARISON:  None Available. FINDINGS: Brain: Mild diffuse cerebral atrophy. Patchy low-attenuation changes in the deep white matter consistent with small vessel ischemia. No mass-effect or midline shift. No abnormal extra-axial fluid collections. The gray-white matter junctions are distinct. Basal cisterns are not effaced. No acute intracranial hemorrhage. Vascular: No hyperdense vessel or unexpected calcification. Skull: Normal. Negative for fracture or focal lesion. Sinuses/Orbits: Mucosal thickening in the right maxillary antrum. No acute air-fluid levels. Mastoid air cells are clear. Postoperative changes in the right globe. Other: None. IMPRESSION: No acute intracranial abnormalities. Chronic atrophic and small vessel ischemic changes, mild for age. Electronically Signed   By: Lucienne Capers M.D.   On: 01/09/2022 20:45   DG Lumbar Spine 2-3 Views  Result Date: 01/09/2022 CLINICAL DATA:  Post fall with  pain. EXAM: LUMBAR SPINE - 2-3 VIEW COMPARISON:  None Available. FINDINGS: Acuity unknown compression deformity of L3 vertebral body with mild height loss and mild posterior listhesis of L3 on L4. Multilevel osteoarthritic changes of the spine. Atherosclerotic disease of the aorta. IMPRESSION: 1. Acuity unknown compression deformity of L3 vertebral body with mild height loss and mild posterior listhesis of L3 on L4. 2. Multilevel osteoarthritic changes of the spine. Electronically Signed   By: Fidela Salisbury M.D.   On: 01/09/2022 20:32   DG Ribs Unilateral W/Chest Right  Result Date: 01/09/2022 CLINICAL DATA:  Status post fall. EXAM: RIGHT RIBS AND CHEST - 3+ VIEW COMPARISON:  None Available. FINDINGS: No fracture or other bone lesions are seen involving the ribs. There is no evidence of pneumothorax or pleural effusion. Both lungs are clear.  Heart size and mediastinal contours are within normal limits. Tortuosity and atherosclerotic disease of the aorta. IMPRESSION: Negative. Electronically Signed   By: Fidela Salisbury M.D.   On: 01/09/2022 20:28    Procedures Procedures    Medications Ordered in ED Medications - No data to display  ED Course/ Medical Decision Making/ A&P    Very pleasant 88yo male with history above presents with concern for fall with right chest wall pain and redness to left eye.  Given age with history of head trauma as well as headache, CT head was completed and personally interpreted by me and radiology and shows no acute ICH. Denies neck pain on history and exam and has no neurologic abnormalities and clinically have low suspicio nfor cervial injury.   Has chronic back pain, XR shows age indeterminate fracture L3--he reports his pain is unchanged, discussed can use brace (has some from his wife) for comfort, follow up if desired with spinal dr, and discussed reasons to return. Do not feel this is an unstable fracture at this time and is likely chronic compression  deformity.  XR chest without signs of fracture or pneumothorax. He is able to take deep breaths, does not have significant pain, feel contusion more likely than occult fracture and discussed recommendation for deep breaths, continued monitoring of symptoms.  Eye findings consistent  with subconjunctival hemorrhage, does not have visual changes or findings to suggest retinal detachment or ocular trauma.  Patient discharged in stable condition with understanding of reasons to return.         Final Clinical Impression(s) / ED Diagnoses Final diagnoses:  Fall, initial encounter  Rib contusion, right, initial encounter  Subconjunctival hemorrhage of left eye  Closed compression fracture of L3 lumbar vertebra, initial encounter [facility])    Rx / DC Orders ED Discharge Orders     None         Gareth Morgan, MD 01/10/22 1142

## 2022-01-09 NOTE — ED Triage Notes (Signed)
Pt fell walking the dog, he fell on his right ribs, and scratched his chin. Pt unsure if he hits his head. Pt later noticed his left eye was bloodshot (he had detached retina in past right eye).

## 2022-01-09 NOTE — ED Notes (Signed)
Pt ambulatory to bathroom, standby assistance.  

## 2022-01-09 NOTE — ED Notes (Signed)
Pt ambulatory to ED exit, no assistance needed.

## 2022-01-09 NOTE — ED Notes (Signed)
Pt discharged home after verbalizing understanding of discharge instructions; nad noted. 

## 2022-01-09 NOTE — ED Notes (Signed)
Patient transported to CT 

## 2022-02-13 DIAGNOSIS — N1831 Chronic kidney disease, stage 3a: Secondary | ICD-10-CM | POA: Diagnosis not present

## 2022-02-13 DIAGNOSIS — Z125 Encounter for screening for malignant neoplasm of prostate: Secondary | ICD-10-CM | POA: Diagnosis not present

## 2022-02-13 DIAGNOSIS — R7989 Other specified abnormal findings of blood chemistry: Secondary | ICD-10-CM | POA: Diagnosis not present

## 2022-02-13 DIAGNOSIS — E039 Hypothyroidism, unspecified: Secondary | ICD-10-CM | POA: Diagnosis not present

## 2022-02-13 DIAGNOSIS — E785 Hyperlipidemia, unspecified: Secondary | ICD-10-CM | POA: Diagnosis not present

## 2022-02-24 ENCOUNTER — Other Ambulatory Visit: Payer: Self-pay | Admitting: Internal Medicine

## 2022-02-24 DIAGNOSIS — R82998 Other abnormal findings in urine: Secondary | ICD-10-CM | POA: Diagnosis not present

## 2022-02-24 DIAGNOSIS — N1831 Chronic kidney disease, stage 3a: Secondary | ICD-10-CM | POA: Diagnosis not present

## 2022-02-24 DIAGNOSIS — K219 Gastro-esophageal reflux disease without esophagitis: Secondary | ICD-10-CM | POA: Diagnosis not present

## 2022-02-24 DIAGNOSIS — E785 Hyperlipidemia, unspecified: Secondary | ICD-10-CM

## 2022-02-24 DIAGNOSIS — Z1331 Encounter for screening for depression: Secondary | ICD-10-CM | POA: Diagnosis not present

## 2022-02-24 DIAGNOSIS — Z1339 Encounter for screening examination for other mental health and behavioral disorders: Secondary | ICD-10-CM | POA: Diagnosis not present

## 2022-02-24 DIAGNOSIS — Z0000 Encounter for general adult medical examination without abnormal findings: Secondary | ICD-10-CM | POA: Diagnosis not present

## 2022-02-24 DIAGNOSIS — L0889 Other specified local infections of the skin and subcutaneous tissue: Secondary | ICD-10-CM | POA: Diagnosis not present

## 2022-02-24 DIAGNOSIS — R002 Palpitations: Secondary | ICD-10-CM | POA: Diagnosis not present

## 2022-02-24 DIAGNOSIS — I4510 Unspecified right bundle-branch block: Secondary | ICD-10-CM | POA: Diagnosis not present

## 2022-02-24 DIAGNOSIS — Z23 Encounter for immunization: Secondary | ICD-10-CM | POA: Diagnosis not present

## 2022-02-24 DIAGNOSIS — I472 Ventricular tachycardia, unspecified: Secondary | ICD-10-CM | POA: Diagnosis not present

## 2022-02-27 DIAGNOSIS — R3121 Asymptomatic microscopic hematuria: Secondary | ICD-10-CM | POA: Diagnosis not present

## 2022-04-07 NOTE — Progress Notes (Unsigned)
Office Visit    Patient Name: Jason Davies Date of Encounter: 04/09/2022  Primary Care Provider:  Crist Infante, MD Primary Cardiologist:  Jason Furbish, MD Primary Electrophysiologist: None  Chief Complaint    Jason Davies is a 88 y.o. male with PMH of ventricular tachycardia, adenoma of duodenum, GERD, RBBB who presents today for 13-monthfollow-up of ventricular tachycardia.  Past Medical History    Past Medical History:  Diagnosis Date   Adenoma of duodenum 2002   stomach ulcer treated then, [facility]x given, [facility] had serial endoscopies 9 or 10 times. Nipping away at the adenoma. It was close to a blood vessel. Got it all   BPH (benign prostatic hyperplasia) 2001   s/p two turps   Cataract (lens) fragments in eye following cataract surgery, bilateral    Detached retina 1982   GERD (gastroesophageal reflux disease)    History of colon polyps    06/2005 due for next colon check. maybe has had some colon polyps in the past.   Increased prostate specific antigen (PSA) velocity    12/17 5.7, was 3.8 in 2011. will recheck in 6 mos and go from there.   [facility] pucker 1984   [facility]   Past Surgical History:  Procedure Laterality Date   LEFT HEART CATH AND CORONARY ANGIOGRAPHY N/A 04/23/2018   Procedure: LEFT HEART CATH AND CORONARY ANGIOGRAPHY;  Surgeon: Jason Crome MD;  [facility];  Service: Cardiovascular;  Laterality: N/A;    Allergies  Allergies  Allergen Reactions   Mucinex D [Pseudoephedrine-Guaifenesin Er] Other (See Comments)    Patient passes out when he takes Mucinex D    History of Present Illness    Jason Davies is a 88year old male with the above mention past medical history who presents today for follow-up of ventricular tachycardia.  Mr. CGutmanhas been followed by Dr. KCaryl Comessince 2019 when he was seen for complaint of syncope.  He was given an event monitor and it revealed ventricular  tachycardia with RBBB morphology.  Left heart cath was completed to evaluate LV function in setting of nonsustained VT.  Cath results showed widely patent coronaries without critical obstructive disease.  Patient was started on flecainide therapy.  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  Flecainide was stopped and patient was loaded on amiodarone for further management. Patient was last seen by Dr. KCaryl Comeson 10/2021 for follow-up.  During visit patient denied any shortness of breath, chest pain or dizziness.  Patient had no recurrent atrial tachycardia and was continued on amiodarone and was decreased to 100 mg daily.  Mr. CBushartpresents today for annual follow-up alone.  Since last being seen in the office patient reports he has been doing well with no new cardiac complaints.  He is tolerating medications without any adverse reactions.  He denies any celebrated heart rhythms or blood pressure fluctuations.  Pressure today was 138/80 with a rate of 64 bpm..  Patient denies chest pain, palpitations, dyspnea, PND, orthopnea, nausea, vomiting, dizziness, syncope, edema, weight gain, or early satiety.   Home Medications    Current Outpatient Medications  Medication Sig Dispense Refill   amiodarone (PACERONE) 100 MG tablet Take 1 tablet (100 mg total) by mouth daily. 90 tablet 3   levothyroxine (SYNTHROID) 50  MCG tablet Take 50 mcg by mouth daily before breakfast.     Multiple Vitamins-Minerals (MULTIVITAMIN WITH MINERALS) tablet Take 1 tablet by mouth daily.     omeprazole (PRILOSEC) 20 MG capsule Take 20 mg by mouth daily.     No current facility-administered medications for this visit.     Review of Systems  Please see the history of present illness.      All other systems reviewed and are otherwise negative except as noted above.  Physical Exam     Wt Readings from Last 3 Encounters:  04/09/22 175 lb (79.4 kg)  10/22/21 178 lb (80.7 kg)  04/17/21 179 lb 3.2 oz (81.3 kg)   VS: Vitals:   04/09/22 1045  BP: 138/80  Pulse: 64  SpO2: 95%  ,Body mass index is 25.11 kg/m.  Constitutional:      Appearance: Healthy appearance. Not in distress.  Neck:     Vascular: JVD normal.  Pulmonary:     Effort: Pulmonary effort is normal.     Breath sounds: No wheezing. No rales. Diminished in the bases Cardiovascular:     Normal rate. Regular rhythm. Normal S1. Normal S2.      Murmurs: There is no murmur.  Edema:    Peripheral edema absent.  Abdominal:     Palpations: Abdomen is soft non tender. There is no hepatomegaly.  Skin:    General: Skin is warm and dry.  Neurological:     General: No focal deficit present.     Mental Status: Alert and oriented to person, place and time.     Cranial Nerves: Cranial nerves are intact.  EKG/LABS/Other Studies Reviewed    ECG personally reviewed by me today -none completed today     Lab Results  Component Value Date   WBC 9.9 04/22/2018   HGB 15.5 04/22/2018   HCT 44.4 04/22/2018   MCV 89 04/22/2018   PLT 198 04/22/2018   Lab Results  Component Value Date   CREATININE 1.08 04/22/2018   BUN 24 04/22/2018   NA 142 04/22/2018   K 4.3 04/22/2018   CL 100 04/22/2018   CO2 26 04/22/2018   Lab Results  Component Value Date   ALT 16 05/27/2018   AST 14 05/27/2018   ALKPHOS 41 05/27/2018   BILITOT 0.5 05/27/2018   No results found for: "CHOL", "HDL", "LDLCALC", "LDLDIRECT", "TRIG", "CHOLHDL"  No results found for: "HGBA1C"  Assessment & Plan    1.  Ventricular tachycardia: -Patient currently on amiodarone and followed by Jason Davies for management of VT -Possible ARVC recent cardiac MRI  2.  RBBB: -Blood pressures well controlled -Patient denies chest pain, SOB, edema  3.  Nonobstructive CAD: -LHC completed 2019 with nonobstructive CAD -Patient reports today no chest pain or  angina.  4.  Syncope: -Resolved patient reports no new or current complaints  5.  Essential hypertension: -Blood pressures today 138/80  Disposition: Follow-up with Jason Furbish, MD or APP in 12 months   Medication Adjustments/Labs and Tests Ordered: Current medicines are reviewed at length with the patient today.  Concerns regarding medicines are outlined above.   Signed, Mable Fill, Marissa Nestle, NP 04/09/2022, 11:39 AM [facility]  Note:  This document was prepared using Dragon voice recognition software and may include unintentional dictation errors.

## 2022-04-09 ENCOUNTER — Ambulatory Visit: Payer: Medicare PPO | Attending: Nurse Practitioner | Admitting: Nurse Practitioner

## 2022-04-09 ENCOUNTER — Encounter: Payer: Self-pay | Admitting: Nurse Practitioner

## 2022-04-09 VITALS — BP 138/80 | HR 64 | Ht 70.0 in | Wt 175.0 lb

## 2022-04-09 DIAGNOSIS — R55 Syncope and collapse: Secondary | ICD-10-CM | POA: Diagnosis not present

## 2022-04-09 DIAGNOSIS — I10 Essential (primary) hypertension: Secondary | ICD-10-CM | POA: Diagnosis not present

## 2022-04-09 DIAGNOSIS — L570 Actinic keratosis: Secondary | ICD-10-CM | POA: Diagnosis not present

## 2022-04-09 DIAGNOSIS — I472 Ventricular tachycardia, unspecified: Secondary | ICD-10-CM

## 2022-04-09 DIAGNOSIS — Z85828 Personal history of other malignant neoplasm of skin: Secondary | ICD-10-CM | POA: Diagnosis not present

## 2022-04-09 DIAGNOSIS — L821 Other seborrheic keratosis: Secondary | ICD-10-CM | POA: Diagnosis not present

## 2022-04-09 DIAGNOSIS — I2510 Atherosclerotic heart disease of native coronary artery without angina pectoris: Secondary | ICD-10-CM | POA: Diagnosis not present

## 2022-04-09 DIAGNOSIS — D235 Other benign neoplasm of skin of trunk: Secondary | ICD-10-CM | POA: Diagnosis not present

## 2022-04-09 DIAGNOSIS — D225 Melanocytic nevi of trunk: Secondary | ICD-10-CM | POA: Diagnosis not present

## 2022-04-09 NOTE — Patient Instructions (Signed)
Medication Instructions:   *If you need a refill on your cardiac medications before your next appointment, please call your pharmacy*   Lab Work:  If you have labs (blood work) drawn today and your tests are completely normal, you will receive your results only by: [place] (if you have MyChart) OR A paper copy in the mail If you have any lab test that is abnormal or we need to change your treatment, we will call you to review the results.   Testing/Procedures:    Follow-Up: At [facility], you and your health needs are our priority.  As part of our continuing mission to provide you with exceptional heart care, we have created designated Provider Care Teams.  These Care Teams include your primary Cardiologist (physician) and Advanced Practice Providers (APPs -  Physician Assistants and Nurse Practitioners) who all work together to provide you with the care you need, when you need it.  We recommend signing up for the patient portal called "MyChart".  Sign up information is provided on this After Visit Summary.  MyChart is used to connect with patients for Virtual Visits (Telemedicine).  Patients are able to view lab/test results, encounter notes, upcoming appointments, etc.  Non-urgent messages can be sent to your provider as well.   To learn more about what you can do with MyChart, go to [email or web address].    Your next appointment:   1 year(s)  The format for your next appointment:   In Person  Provider:   Candee Furbish, MD     Other Instructions   Important Information About Sugar

## 2022-05-08 DIAGNOSIS — Z23 Encounter for immunization: Secondary | ICD-10-CM | POA: Diagnosis not present

## 2022-05-15 ENCOUNTER — Other Ambulatory Visit: Payer: Medicare PPO

## 2022-05-16 ENCOUNTER — Ambulatory Visit
Admission: RE | Admit: 2022-05-16 | Discharge: 2022-05-16 | Disposition: A | Discharge status: Home / Self Care | Payer: No Typology Code available for payment source | Source: Ambulatory Visit | Attending: Internal Medicine | Admitting: Internal Medicine

## 2022-05-16 DIAGNOSIS — E785 Hyperlipidemia, unspecified: Secondary | ICD-10-CM

## 2022-05-16 DIAGNOSIS — I700 Atherosclerosis of aorta: Secondary | ICD-10-CM | POA: Diagnosis not present

## 2022-06-11 DIAGNOSIS — H52203 Unspecified astigmatism, bilateral: Secondary | ICD-10-CM | POA: Diagnosis not present

## 2022-06-11 DIAGNOSIS — H31001 Unspecified chorioretinal scars, right eye: Secondary | ICD-10-CM | POA: Diagnosis not present

## 2022-06-11 DIAGNOSIS — H5203 Hypermetropia, bilateral: Secondary | ICD-10-CM | POA: Diagnosis not present

## 2022-06-11 DIAGNOSIS — D3132 Benign neoplasm of left choroid: Secondary | ICD-10-CM | POA: Diagnosis not present

## 2022-07-22 DIAGNOSIS — C44529 Squamous cell carcinoma of skin of other part of trunk: Secondary | ICD-10-CM | POA: Diagnosis not present

## 2022-07-22 DIAGNOSIS — D485 Neoplasm of uncertain behavior of skin: Secondary | ICD-10-CM | POA: Diagnosis not present

## 2022-07-22 DIAGNOSIS — Z85828 Personal history of other malignant neoplasm of skin: Secondary | ICD-10-CM | POA: Diagnosis not present

## 2022-09-11 DIAGNOSIS — I700 Atherosclerosis of aorta: Secondary | ICD-10-CM | POA: Diagnosis not present

## 2022-09-11 DIAGNOSIS — N1831 Chronic kidney disease, stage 3a: Secondary | ICD-10-CM | POA: Diagnosis not present

## 2022-09-11 DIAGNOSIS — I2510 Atherosclerotic heart disease of native coronary artery without angina pectoris: Secondary | ICD-10-CM | POA: Diagnosis not present

## 2022-09-11 DIAGNOSIS — E039 Hypothyroidism, unspecified: Secondary | ICD-10-CM | POA: Diagnosis not present

## 2022-09-11 DIAGNOSIS — I472 Ventricular tachycardia, unspecified: Secondary | ICD-10-CM | POA: Diagnosis not present

## 2022-09-11 DIAGNOSIS — I4510 Unspecified right bundle-branch block: Secondary | ICD-10-CM | POA: Diagnosis not present

## 2022-09-11 DIAGNOSIS — E785 Hyperlipidemia, unspecified: Secondary | ICD-10-CM | POA: Diagnosis not present

## 2023-01-14 ENCOUNTER — Encounter: Payer: Self-pay | Admitting: Registered Nurse

## 2023-01-14 ENCOUNTER — Other Ambulatory Visit (HOSPITAL_BASED_OUTPATIENT_CLINIC_OR_DEPARTMENT_OTHER): Payer: Self-pay | Admitting: Registered Nurse

## 2023-01-14 ENCOUNTER — Other Ambulatory Visit: Payer: Self-pay | Admitting: Registered Nurse

## 2023-01-14 ENCOUNTER — Ambulatory Visit (HOSPITAL_BASED_OUTPATIENT_CLINIC_OR_DEPARTMENT_OTHER)
Admission: RE | Admit: 2023-01-14 | Discharge: 2023-01-14 | Disposition: A | Discharge status: Home / Self Care | Payer: Medicare PPO | Source: Ambulatory Visit | Attending: Registered Nurse | Admitting: Registered Nurse

## 2023-01-14 DIAGNOSIS — Z8711 Personal history of peptic ulcer disease: Secondary | ICD-10-CM | POA: Diagnosis not present

## 2023-01-14 DIAGNOSIS — R112 Nausea with vomiting, unspecified: Secondary | ICD-10-CM | POA: Diagnosis not present

## 2023-01-14 DIAGNOSIS — R1010 Upper abdominal pain, unspecified: Secondary | ICD-10-CM | POA: Insufficient documentation

## 2023-01-14 DIAGNOSIS — R1084 Generalized abdominal pain: Secondary | ICD-10-CM | POA: Diagnosis not present

## 2023-01-14 DIAGNOSIS — K828 Other specified diseases of gallbladder: Secondary | ICD-10-CM | POA: Diagnosis not present

## 2023-01-14 DIAGNOSIS — N400 Enlarged prostate without lower urinary tract symptoms: Secondary | ICD-10-CM | POA: Diagnosis not present

## 2023-01-14 DIAGNOSIS — R748 Abnormal levels of other serum enzymes: Secondary | ICD-10-CM | POA: Diagnosis not present

## 2023-01-14 DIAGNOSIS — K219 Gastro-esophageal reflux disease without esophagitis: Secondary | ICD-10-CM | POA: Diagnosis not present

## 2023-01-14 MED ORDER — IOHEXOL 300 MG/ML  SOLN
100.0000 mL | Freq: Once | INTRAMUSCULAR | Status: AC | PRN
  Administered 2023-01-14: 85 mL via INTRAVENOUS

## 2023-01-16 ENCOUNTER — Ambulatory Visit: Payer: Medicare PPO | Admitting: Internal Medicine

## 2023-01-16 DIAGNOSIS — R748 Abnormal levels of other serum enzymes: Secondary | ICD-10-CM | POA: Diagnosis not present

## 2023-01-18 ENCOUNTER — Ambulatory Visit (HOSPITAL_BASED_OUTPATIENT_CLINIC_OR_DEPARTMENT_OTHER)
Admission: RE | Admit: 2023-01-18 | Discharge: 2023-01-18 | Disposition: A | Discharge status: Home / Self Care | Payer: Medicare PPO | Source: Ambulatory Visit | Attending: Registered Nurse | Admitting: Registered Nurse

## 2023-01-18 DIAGNOSIS — R1011 Right upper quadrant pain: Secondary | ICD-10-CM | POA: Diagnosis not present

## 2023-01-18 DIAGNOSIS — R112 Nausea with vomiting, unspecified: Secondary | ICD-10-CM | POA: Insufficient documentation

## 2023-01-18 DIAGNOSIS — R1010 Upper abdominal pain, unspecified: Secondary | ICD-10-CM | POA: Insufficient documentation

## 2023-01-18 DIAGNOSIS — R1110 Vomiting, unspecified: Secondary | ICD-10-CM | POA: Diagnosis not present

## 2023-01-21 ENCOUNTER — Other Ambulatory Visit: Payer: Medicare PPO

## 2023-01-21 LAB — POCT I-STAT CREATININE: Creatinine, Ser: 1.3 mg/dL — ABNORMAL HIGH (ref 0.61–1.24)

## 2023-02-05 DIAGNOSIS — I472 Ventricular tachycardia, unspecified: Secondary | ICD-10-CM | POA: Diagnosis not present

## 2023-02-05 DIAGNOSIS — I4510 Unspecified right bundle-branch block: Secondary | ICD-10-CM | POA: Diagnosis not present

## 2023-02-05 DIAGNOSIS — K8010 Calculus of gallbladder with chronic cholecystitis without obstruction: Secondary | ICD-10-CM | POA: Diagnosis not present

## 2023-02-06 ENCOUNTER — Telehealth: Payer: Self-pay | Admitting: *Deleted

## 2023-02-06 ENCOUNTER — Telehealth: Payer: Self-pay

## 2023-02-06 NOTE — Telephone Encounter (Signed)
Pt returned phone call and is scheduled for 8/8 at 9am for tele preop clearance.

## 2023-02-06 NOTE — Telephone Encounter (Signed)
Pt returned phone call and is scheduled for 8/8 at 9am for tele preop clearance.     Patient Consent for Virtual Visit        Jason Davies has provided verbal consent on 02/06/2023 for a virtual visit (video or telephone).   CONSENT FOR VIRTUAL VISIT FOR:  Jason Davies  By participating in this virtual visit I agree to the following:  I hereby voluntarily request, consent and authorize [facility] and its employed or contracted physicians, physician assistants, nurse practitioners or other licensed health care professionals (the Practitioner), to provide me with telemedicine health care services (the "Services") as deemed necessary by the treating Practitioner. I acknowledge and consent to receive the Services by the Practitioner via telemedicine. I understand that the telemedicine visit will involve communicating with the Practitioner through live audiovisual communication technology and the disclosure of certain medical information by electronic transmission. I acknowledge that I have been given the opportunity to request an in-person assessment or other available alternative prior to the telemedicine visit and am voluntarily participating in the telemedicine visit.  I understand that I have the right to withhold or withdraw my consent to the use of telemedicine in the course of my care at any time, without affecting my right to future care or treatment, and that the Practitioner or I may terminate the telemedicine visit at any time. I understand that I have the right to inspect all information obtained and/or recorded in the course of the telemedicine visit and may receive copies of available information for a reasonable fee.  I understand that some of the potential risks of receiving the Services via telemedicine include:  Delay or interruption in medical evaluation due to technological equipment failure or disruption; Information transmitted may not be sufficient (e.g. poor  resolution of images) to allow for appropriate medical decision making by the Practitioner; and/or  In rare instances, security protocols could fail, causing a breach of personal health information.  Furthermore, I acknowledge that it is my responsibility to provide information about my medical history, conditions and care that is complete and accurate to the best of my ability. I acknowledge that Practitioner's advice, recommendations, and/or decision may be based on factors not within their control, such as incomplete or inaccurate data provided by me or distortions of diagnostic images or specimens that may result from electronic transmissions. I understand that the practice of medicine is not an exact science and that Practitioner makes no warranties or guarantees regarding treatment outcomes. I acknowledge that a copy of this consent can be made available to me via my patient portal [facility] MyChart), or I can request a printed copy by calling the office of [facility].    I understand that my insurance will be billed for this visit.   I have read or had this consent read to me. I understand the contents of this consent, which adequately explains the benefits and risks of the Services being provided via telemedicine.  I have been provided ample opportunity to ask questions regarding this consent and the Services and have had my questions answered to my satisfaction. I give my informed consent for the services to be provided through the use of telemedicine in my medical care

## 2023-02-06 NOTE — Telephone Encounter (Signed)
Our office received a clearance request today. I had to call the surgeon's office to confirm the DOB, clearance request states DOB is [birth year]. Our chart for the pt states [birth year]. In my conversation with the surgeon's office, it was stated that they have to go with the DOB what the insurance company has. I was told that their office does scan insurance card or ID. I did tell the surgeon's office that we have the pt's Drivers license which reflect DOB [birth year].   It is very important that we confirm the correct DOB.

## 2023-02-06 NOTE — Telephone Encounter (Signed)
   Pre-operative Risk Assessment    Patient Name: Jason Davies  DOB: [birth year] MRN: [id number]      Request for Surgical Clearance    Procedure:   GALLBLADDER REMOVAL   Date of Surgery:  Clearance TBD                                 Surgeon:  DR. Feliciana Rossetti Surgeon's Group or Practice Name:  [facility] Phone number:  [phone] Fax number:  [phone] ATTN: Santiago Glad, CMA   Type of Clearance Requested:   - Medical ; NO MEDICATIONS ARE LISTED AS NEEDING TO BE HELD   Type of Anesthesia:  General    Additional requests/questions:    Elpidio Anis   02/06/2023, 9:49 AM

## 2023-02-06 NOTE — Telephone Encounter (Signed)
   Name: Jason Davies  DOB: [birth year]  MRN: [id number]  Primary Cardiologist: Donato Schultz, MD   Preoperative team, please contact this patient and set up a phone call appointment for further preoperative risk assessment. Please obtain consent and complete medication review. Thank you for your help.  I confirm that guidance regarding antiplatelet and oral anticoagulation therapy has been completed and, if necessary, noted below.  No anticoagulation or antiplatelets seen on notes or med list.    Joni Reining, NP 02/06/2023, 11:33 AM [facility]

## 2023-02-06 NOTE — Telephone Encounter (Signed)
I tried reaching pt to schedule tele appt for preop clearance. I lvmtrc

## 2023-02-17 NOTE — Progress Notes (Unsigned)
Virtual Visit via Telephone Note   Because of Elyes Whitesell Tenorio's co-morbid illnesses, he is at least at moderate risk for complications without adequate follow up.  This format is felt to be most appropriate for this patient at this time.  The patient did not have access to video technology/had technical difficulties with video requiring transitioning to audio format only (telephone).  All issues noted in this document were discussed and addressed.  No physical exam could be performed with this format.  Please refer to the patient's chart for his consent to telehealth for [facility].  Evaluation Performed:  Preoperative cardiovascular risk assessment _____________   Date:  02/19/2023   Patient ID:  MELBOURNE WHITEAKER, DOB June 14, 1928, MRN [id number] Patient Location:  Home Provider location:   Office  Primary Care Provider:  Rodrigo Ran, MD Primary Cardiologist:  Donato Schultz, MD  Chief Complaint / Patient Profile   89 y.o. y/o male with a h/o ventricular tachycardia, right bundle branch block, normal coronaries by cardiac catheterization in 2019, GERD, and adenoma of the duodenum, who is pending cholecystectomy by Dr. Feliciana Rossetti with [facility] on Sept 3, 2024  He presents today for telephonic preoperative cardiovascular risk assessment.  History of Present Illness    DHRUVIN DAGES is a 89 y.o. male who presents via audio/video conferencing for a telehealth visit today.  Pt was last seen in cardiology clinic on 04/09/2022 by Robin Searing, NP.Marland Kitchen  At that time HAEDYN ODLE was doing well.  The patient is now pending procedure as outlined above. Since his last visit, he   Past Medical History    Past Medical History:  Diagnosis Date   Adenoma of duodenum 2002   stomach ulcer treated then, abx given, then referred to [facility] 10/2 through 2006 had serial endoscopies 9 or 10 times. Nipping away at the adenoma. It was close to a blood vessel. Got it all   BPH (benign  prostatic hyperplasia) 2001   s/p two turps   Cataract (lens) fragments in eye following cataract surgery, bilateral    Detached retina 1982   GERD (gastroesophageal reflux disease)    History of colon polyps    06/2005 due for next colon check. maybe has had some colon polyps in the past.   Increased prostate specific antigen (PSA) velocity    12/17 5.7, was 3.8 in 2011. will recheck in 6 mos and go from there.   Macular pucker 1984   surgery at [facility]    Past Surgical History:  Procedure Laterality Date   LEFT HEART CATH AND CORONARY ANGIOGRAPHY N/A 04/23/2018   Procedure: LEFT HEART CATH AND CORONARY ANGIOGRAPHY;  Surgeon: Lyn Records, MD;  [facility] IN[facility];  Service: Cardiovascular;  Laterality: N/A;    Allergies  Allergies  Allergen Reactions   Mucinex D [Pseudoephedrine-Guaifenesin Er] Other (See Comments)    Patient passes out when he takes Mucinex D    Home Medications    Prior to Admission medications   Medication Sig Start Date End Date Taking? Authorizing Provider  amiodarone (PACERONE) 100 MG tablet Take 1 tablet (100 mg total) by mouth daily. 10/22/21   Duke Salvia, MD  levothyroxine (SYNTHROID) 50 MCG tablet Take 50 mcg by mouth daily before breakfast.    [provider]  Multiple Vitamins-Minerals (MULTIVITAMIN WITH MINERALS) tablet Take 1 tablet by mouth daily.    [provider]  omeprazole (PRILOSEC) 20 MG capsule Take 20 mg by mouth daily.  [provider]    Physical Exam    Vital Signs:  Samay Burritt [place] does not have vital signs available for review today.  Given telephonic nature of communication, physical exam is limited. AAOx3. NAD. Normal affect.  Speech and respirations are unlabored.  Accessory Clinical Findings    None  Assessment & Plan    1.  Preoperative Cardiovascular Risk Assessment: According to the Revised Cardiac Risk Index (RCRI), his Perioperative Risk of Major Cardiac Event is (%):  0.4  His Functional Capacity in METs is: 8.97 according to the [facility] Activity Status Index (DASI).   The patient was advised that if he develops new symptoms prior to surgery to contact our office to arrange for a follow-up visit, and he verbalized understanding.  Therefore, based on ACC/AHA guidelines, patient would be at acceptable risk for the planned procedure without further cardiovascular testing. I will route this recommendation to the requesting party via Epic fax function.    Time:   Today, I have spent 10 minutes with the patient with telehealth technology discussing medical history, symptoms, and management plan.     Joni Reining, NP  02/19/2023, 9:02 AM

## 2023-02-19 ENCOUNTER — Ambulatory Visit: Payer: Medicare PPO | Attending: Internal Medicine

## 2023-02-19 DIAGNOSIS — Z01810 Encounter for preprocedural cardiovascular examination: Secondary | ICD-10-CM

## 2023-02-19 DIAGNOSIS — Z01818 Encounter for other preprocedural examination: Secondary | ICD-10-CM

## 2023-02-27 NOTE — Progress Notes (Signed)
Surgery orders requested via Epic inbox. °

## 2023-03-02 ENCOUNTER — Ambulatory Visit: Payer: Self-pay | Admitting: General Surgery

## 2023-03-03 ENCOUNTER — Ambulatory Visit: Payer: Medicare PPO | Admitting: Internal Medicine

## 2023-03-03 NOTE — Patient Instructions (Signed)
SURGICAL WAITING ROOM VISITATION  Patients having surgery or a procedure may have no more than 2 support people in the waiting area - these visitors may rotate.    Children under the age of 79 must have an adult with them who is not the patient.  Due to an increase in RSV and influenza rates and associated hospitalizations, children ages 94 and under may not visit patients in [facility] hospitals.  If the patient needs to stay at the hospital during part of their recovery, the visitor guidelines for inpatient rooms apply. Pre-op nurse will coordinate an appropriate time for 1 support person to accompany patient in pre-op.  This support person may not rotate.    Please refer to the [facility] website for the visitor guidelines for Inpatients (after your surgery is over and you are in a regular room).       Your procedure is scheduled on:  03/17/2023    Report to [facility] Main Entrance    Report to admitting at  200 pm    Call this number if you have problems the morning of surgery [phone]   Do not eat food :After Midnight.   After Midnight you may have the following liquids until _ 100pm _____ DAY OF SURGERY  Water Non-Citrus Juices (without pulp, NO RED-Apple, White grape, White cranberry) Black Coffee (NO MILK/CREAM OR CREAMERS, sugar ok)  Clear Tea (NO MILK/CREAM OR CREAMERS, sugar ok) regular and decaf                             Plain Jell-O (NO RED)                                           Fruit ices (not with fruit pulp, NO RED)                                     Popsicles (NO RED)                                                               Sports drinks like Gatorade (NO RED)                   The day of surgery:  Drink ONE (1) Pre-Surgery Clear Ensure or G2 at  100 pm    the  day of surgery. Drink in one sitting. Do not sip.  This drink was given to you during your hospital  pre-op appointment visit. Nothing else to drink after completing the   Pre-Surgery Clear Ensure or G2.          If you have questions, please contact your surgeon's office.    Oral Hygiene is also important to reduce your risk of infection.                                    Remember - BRUSH YOUR TEETH THE MORNING OF SURGERY WITH YOUR REGULAR TOOTHPASTE  DENTURES  WILL BE REMOVED PRIOR TO SURGERY PLEASE DO NOT APPLY "Poly grip" OR ADHESIVES!!!   Do NOT smoke after Midnight   Stop all vitamins and herbal supplements 7 days before surgery.   Take these medicines the morning of surgery with A SIP OF WATER:  amiodarone, synthroid, omeprazole   DO NOT TAKE ANY ORAL DIABETIC MEDICATIONS DAY OF YOUR SURGERY  Bring CPAP mask and tubing day of surgery.                              You may not have any metal on your body including hair pins, jewelry, and body piercing             Do not wear make-up, lotions, powders, perfumes/cologne, or deodorant  Do not wear nail polish including gel and S&S, artificial/acrylic nails, or any other type of covering on natural nails including finger and toenails. If you have artificial nails, gel coating, etc. that needs to be removed by a nail salon please have this removed prior to surgery or surgery may need to be canceled/ delayed if the surgeon/ anesthesia feels like they are unable to be safely monitored.   Do not shave  48 hours prior to surgery.               Men may shave face and neck.   Do not bring valuables to the hospital. [facility] IS NOT             RESPONSIBLE   FOR VALUABLES.   Contacts, glasses, dentures or bridgework may not be worn into surgery.   Bring small overnight bag day of surgery.   DO NOT BRING YOUR HOME MEDICATIONS TO THE HOSPITAL. PHARMACY WILL DISPENSE MEDICATIONS LISTED ON YOUR MEDICATION LIST TO YOU DURING YOUR ADMISSION IN THE HOSPITAL!    Patients discharged on the day of surgery will not be allowed to drive home.  Someone NEEDS to stay with you for the first 24 hours after  anesthesia.   Special Instructions: Bring a copy of your healthcare power of attorney and living will documents the day of surgery if you haven't scanned them before.              Please read over the following fact sheets you were given: IF YOU HAVE QUESTIONS ABOUT YOUR PRE-OP INSTRUCTIONS PLEASE CALL [phone]   If you received a COVID test during your pre-op visit  it is requested that you wear a mask when out in public, stay away from anyone that may not be feeling well and notify your surgeon if you develop symptoms. If you test positive for Covid or have been in contact with anyone that has tested positive in the last 10 days please notify you surgeon.    [facility] - Preparing for Surgery Before surgery, you can play an important role.  Because skin is not sterile, your skin needs to be as free of germs as possible.  You can reduce the number of germs on your skin by washing with CHG (chlorahexidine gluconate) soap before surgery.  CHG is an antiseptic cleaner which kills germs and bonds with the skin to continue killing germs even after washing. Please DO NOT use if you have an allergy to CHG or antibacterial soaps.  If your skin becomes reddened/irritated stop using the CHG and inform your nurse when you arrive at Short Stay. Do not shave (including legs and underarms) for at  least 48 hours prior to the first CHG shower.  You may shave your face/neck. Please follow these instructions carefully:  1.  Shower with CHG Soap the night before surgery and the  morning of Surgery.  2.  If you choose to wash your hair, wash your hair first as usual with your  normal  shampoo.  3.  After you shampoo, rinse your hair and body thoroughly to remove the  shampoo.                           4.  Use CHG as you would any other liquid soap.  You can apply chg directly  to the skin and wash                       Gently with a scrungie or clean washcloth.  5.  Apply the CHG Soap to your body ONLY FROM THE  NECK DOWN.   Do not use on face/ open                           Wound or open sores. Avoid contact with eyes, ears mouth and genitals (private parts).                       Wash face,  Genitals (private parts) with your normal soap.             6.  Wash thoroughly, paying special attention to the area where your surgery  will be performed.  7.  Thoroughly rinse your body with warm water from the neck down.  8.  DO NOT shower/wash with your normal soap after using and rinsing off  the CHG Soap.                9.  Pat yourself dry with a clean towel.            10.  Wear clean pajamas.            11.  Place clean sheets on your bed the night of your first shower and do not  sleep with pets. Day of Surgery : Do not apply any lotions/deodorants the morning of surgery.  Please wear clean clothes to the hospital/surgery center.  FAILURE TO FOLLOW THESE INSTRUCTIONS MAY RESULT IN THE CANCELLATION OF YOUR SURGERY PATIENT SIGNATURE_________________________________  NURSE SIGNATURE__________________________________  ________________________________________________________________________

## 2023-03-03 NOTE — Progress Notes (Addendum)
Anesthesia Review:  PCP: Rodrigo Ran  Cardiologist :  Maceo Pro Dick,NP LOV 04/09/22 02/19/23- telephone visit for clearance on 02/19/23  Chest x-ray : EKG : 03/06/23  Echo : Stress test: 2019  Cardiac Cath :  2019  MR Card- 2019  Activity level: can do a flgiht of stairs without difficutly  Sleep Study/ CPAP : hx of sleep apnea has not used cpap in at least 7 years per pt  Fasting Blood Sugar :      / Checks Blood Sugar -- times a day:   Blood Thinner/ Instructions /Last Dose: ASA / Instructions/ Last Dose :

## 2023-03-04 DIAGNOSIS — R7989 Other specified abnormal findings of blood chemistry: Secondary | ICD-10-CM | POA: Diagnosis not present

## 2023-03-04 DIAGNOSIS — E785 Hyperlipidemia, unspecified: Secondary | ICD-10-CM | POA: Diagnosis not present

## 2023-03-04 DIAGNOSIS — Z125 Encounter for screening for malignant neoplasm of prostate: Secondary | ICD-10-CM | POA: Diagnosis not present

## 2023-03-04 DIAGNOSIS — E039 Hypothyroidism, unspecified: Secondary | ICD-10-CM | POA: Diagnosis not present

## 2023-03-06 ENCOUNTER — Other Ambulatory Visit: Payer: Self-pay

## 2023-03-06 ENCOUNTER — Encounter (HOSPITAL_COMMUNITY): Payer: Self-pay

## 2023-03-06 ENCOUNTER — Encounter (HOSPITAL_COMMUNITY)
Admission: RE | Admit: 2023-03-06 | Discharge: 2023-03-06 | Disposition: A | Discharge status: Home / Self Care | Payer: Medicare PPO | Source: Ambulatory Visit | Attending: General Surgery | Admitting: General Surgery

## 2023-03-06 VITALS — BP 110/69 | HR 85 | Temp 98.4°F | Resp 16 | Ht 70.5 in | Wt 163.0 lb

## 2023-03-06 DIAGNOSIS — K8010 Calculus of gallbladder with chronic cholecystitis without obstruction: Secondary | ICD-10-CM | POA: Insufficient documentation

## 2023-03-06 DIAGNOSIS — Z01818 Encounter for other preprocedural examination: Secondary | ICD-10-CM | POA: Insufficient documentation

## 2023-03-06 DIAGNOSIS — E039 Hypothyroidism, unspecified: Secondary | ICD-10-CM | POA: Insufficient documentation

## 2023-03-06 DIAGNOSIS — G4730 Sleep apnea, unspecified: Secondary | ICD-10-CM | POA: Insufficient documentation

## 2023-03-06 DIAGNOSIS — Z87891 Personal history of nicotine dependence: Secondary | ICD-10-CM | POA: Diagnosis not present

## 2023-03-06 HISTORY — DX: Hypothyroidism, unspecified: E03.9

## 2023-03-06 HISTORY — DX: Sleep apnea, unspecified: G47.30

## 2023-03-06 HISTORY — DX: Malignant (primary) neoplasm, unspecified: C80.1

## 2023-03-06 LAB — CBC
HCT: 46 % (ref 39.0–52.0)
Hemoglobin: 15.3 g/dL (ref 13.0–17.0)
MCH: 30.7 pg (ref 26.0–34.0)
MCHC: 33.3 g/dL (ref 30.0–36.0)
MCV: 92.2 fL (ref 80.0–100.0)
Platelets: 160 10*3/uL (ref 150–400)
RBC: 4.99 MIL/uL (ref 4.22–5.81)
RDW: 13.5 % (ref 11.5–15.5)
WBC: 8.8 10*3/uL (ref 4.0–10.5)
nRBC: 0 % (ref 0.0–0.2)

## 2023-03-06 LAB — BASIC METABOLIC PANEL
Anion gap: 8 (ref 5–15)
BUN: 25 mg/dL — ABNORMAL HIGH (ref 8–23)
CO2: 26 mmol/L (ref 22–32)
Calcium: 8.8 mg/dL — ABNORMAL LOW (ref 8.9–10.3)
Chloride: 102 mmol/L (ref 98–111)
Creatinine, Ser: 1.3 mg/dL — ABNORMAL HIGH (ref 0.61–1.24)
GFR, Estimated: 52 mL/min — ABNORMAL LOW (ref >60)
Glucose, Bld: 110 mg/dL — ABNORMAL HIGH (ref 70–99)
Potassium: 4.6 mmol/L (ref 3.5–5.1)
Sodium: 136 mmol/L (ref 135–145)

## 2023-03-09 NOTE — Progress Notes (Signed)
Anesthesia Chart Review   Case: [id number] Date/Time: 03/17/23 1453   Procedure: LAPAROSCOPIC CHOLECYSTECTOMY - 60   Anesthesia type: General   Pre-op diagnosis: CHRONIC CALCULOUS CHOLECYSTITIS   Location: [facility] ROOM 01 / [facility]   Surgeons: Kinsinger, De Blanch, MD       DISCUSSION:89 y.o. former smoker with h/o sleep apnea, hypothyroidism, chronic cholecystitis scheduled for above procedure 03/17/2023 with Dr. Feliciana Rossetti.   Per cardiology preoperative evaluation 02/19/2023, "According to the Revised Cardiac Risk Index (RCRI), his Perioperative Risk of Major Cardiac Event is (%): 0.4   His Functional Capacity in METs is: 8.97 according to the [facility] Activity Status Index (DASI).    The patient was advised that if he develops new symptoms prior to surgery to contact our office to arrange for a follow-up visit, and he verbalized understanding.   Therefore, based on ACC/AHA guidelines, patient would be at acceptable risk for the planned procedure without further cardiovascular testing. I will route this recommendation to the requesting party via Epic fax function." VS: BP 110/69   Pulse 85   Temp 36.9 C (Oral)   Resp 16   Ht 5' 10.5" (1.791 m)   Wt 73.9 kg   SpO2 98%   BMI 23.06 kg/m   PROVIDERS: Rodrigo Ran, MD is PCP   Primary Cardiologist:  Donato Schultz, MD    LABS: Labs reviewed: Acceptable for surgery. (all labs ordered are listed, but only abnormal results are displayed)  Labs Reviewed  BASIC METABOLIC PANEL - Abnormal; Notable for the following components:      Result Value   Glucose, Bld 110 (*)    BUN 25 (*)    Creatinine, Ser 1.30 (*)    Calcium 8.8 (*)    GFR, Estimated 52 (*)    All other components within normal limits  CBC     IMAGES:   EKG:   CV:  Past Medical History:  Diagnosis Date   Adenoma of duodenum 2002   stomach ulcer treated then, abx given, then referred to [facility] 10/2 through 2006 had serial endoscopies 9 or 10 times. Nipping away at  the adenoma. It was close to a blood vessel. Got it all   BPH (benign prostatic hyperplasia) 2001   s/p two turps   Cancer (HCC)    hx of skin cancer   Cataract (lens) fragments in eye following cataract surgery, bilateral    Detached retina 1982   GERD (gastroesophageal reflux disease)    History of colon polyps    06/2005 due for next colon check. maybe has had some colon polyps in the past.   Hypothyroidism    Increased prostate specific antigen (PSA) velocity    12/17 5.7, was 3.8 in 2011. will recheck in 6 mos and go from there.   Macular pucker 1984   surgery at [facility]    Sleep apnea    not used in atleast 7 years per pt    Past Surgical History:  Procedure Laterality Date   detached retina surgery right eye      LEFT HEART CATH AND CORONARY ANGIOGRAPHY N/A 04/23/2018   Procedure: LEFT HEART CATH AND CORONARY ANGIOGRAPHY;  Surgeon: Lyn Records, MD;  [facility] CV LAB;  Service: Cardiovascular;  Laterality: N/A;   macular pucker surgery      TONSILLECTOMY     UPPER GI ENDOSCOPY      MEDICATIONS:  acetaminophen (TYLENOL) 325 MG tablet   amiodarone (PACERONE) 200 MG tablet  CRESTOR 10 MG tablet   levothyroxine (SYNTHROID) 50 MCG tablet   Multiple Vitamins-Minerals (MULTIVITAMIN WITH MINERALS) tablet   omeprazole (PRILOSEC) 20 MG capsule   No current facility-administered medications for this encounter.   Jodell Cipro Ward, PA-C [facility] Pre-Surgical Testing [phone]

## 2023-03-10 NOTE — Progress Notes (Unsigned)
Cardiology Office Note Date:  03/10/2023  Patient ID:  Haim, Kilpela [birth year], MRN [id number] PCP:  Rodrigo Ran, MD  Cardiologist: Dr. Anne Fu Cardiologist:  Dr. Graciela Husbands    Chief Complaint: *** annual visit, pre-op   History of Present Illness: Jason Davies is a 89 y.o. male with history of BPH, GERD, hypothyroidism (follows with Dr. Waynard Edwards), exercise induced VT, RBBB  I saw him 08/08/19, amiodarone continued, in d/w Dr. Graciela Husbands, given advanced age, continue amiodarone with no plans for ICD  Saw Dr. Graciela Husbands last 10/22/21, doing well, no symptoms of VT, amiodarone reduced to 100mg  daily, labs via PMD  Last saw gen cards team 04/09/22, E. Dick, NP, again doing well, denied cardiac symptoms, no changes made.  Pending cholecystectomy by Dr. Feliciana Rossetti with [facility] on Sept 3, 2024  Telephone visit with Carron Curie, PA-C, 02/19/23, reported no symptoms, acceptable cardiac risk, 0.4%, METS 8.97   *** amiodarone labs *** symptoms ***    AAD Hx Flecainide started Oct 2019, stopped 2/2 flushing Amiodarone started Oct 2019   Past Medical History:  Diagnosis Date   Adenoma of duodenum 2002   stomach ulcer treated then, abx given, then referred to [facility] 10/2 through 2006 had serial endoscopies 9 or 10 times. Nipping away at the adenoma. It was close to a blood vessel. Got it all   BPH (benign prostatic hyperplasia) 2001   s/p two turps   Cancer (HCC)    hx of skin cancer   Cataract (lens) fragments in eye following cataract surgery, bilateral    Detached retina 1982   GERD (gastroesophageal reflux disease)    History of colon polyps    06/2005 due for next colon check. maybe has had some colon polyps in the past.   Hypothyroidism    Increased prostate specific antigen (PSA) velocity    12/17 5.7, was 3.8 in 2011. will recheck in 6 mos and go from there.   Macular pucker 1984   surgery at [facility]    Sleep apnea    not used in atleast 7 years per pt    Past Surgical  History:  Procedure Laterality Date   detached retina surgery right eye      LEFT HEART CATH AND CORONARY ANGIOGRAPHY N/A 04/23/2018   Procedure: LEFT HEART CATH AND CORONARY ANGIOGRAPHY;  Surgeon: Lyn Records, MD;  [facility] LAB;  Service: Cardiovascular;  Laterality: N/A;   macular pucker surgery      TONSILLECTOMY     UPPER GI ENDOSCOPY      Current Outpatient Medications  Medication Sig Dispense Refill   acetaminophen (TYLENOL) 325 MG tablet Take 325-650 mg by mouth daily as needed (pain.).     amiodarone (PACERONE) 200 MG tablet Take 100 mg by mouth in the morning.     CRESTOR 10 MG tablet Take 10 mg by mouth daily.     levothyroxine (SYNTHROID) 50 MCG tablet Take 50 mcg by mouth daily before breakfast.     Multiple Vitamins-Minerals (MULTIVITAMIN WITH MINERALS) tablet Take 1 tablet by mouth daily after breakfast.     omeprazole (PRILOSEC) 20 MG capsule Take 20 mg by mouth in the morning.     No current facility-administered medications for this visit.    Allergies:   Mucinex d [pseudoephedrine-guaifenesin er]   Social History:  The patient  reports that he has quit smoking. His smoking use included pipe. He started smoking about 74 years ago. He has never used  smokeless tobacco. He reports that he does not currently use alcohol. He reports that he does not use drugs.   Family History:  The patient's family history includes Breast cancer in his sister; Other in his father and sister.  ROS:  Please see the history of present illness.    All other systems are reviewed and otherwise negative.   PHYSICAL EXAM:  VS:  There were no vitals taken for this visit. BMI: There is no height or weight on file to calculate BMI. Well nourished, well developed, in no acute distress  HEENT: normocephalic, atraumatic  Neck: no JVD, carotid bruits or masses Cardiac: *** RRR; no significant murmurs, no rubs, or gallops Lungs: *** CTA b/l, no wheezing, rhonchi or rales  Abd:  soft, nontender MS: no deformity, age appropriate atrophy Ext: *** no edema  Skin: warm and dry, no rash Neuro:  No gross deficits appreciated Psych: euthymic mood, full affect   EKG:  Not done today  05/12/2018: ETT, negative  05/10/2018: c.MRI IMPRESSION: 1. Normal left ventricular size, thickness and systolic function (LVEF = 59%). There are no regional wall motion abnormalities. There is no late gadolinium enhancement in the left ventricular myocardium.   2. 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    04/23/2018: LHC Variant coronary anatomy with right dominance, relatively small distribution LAD that does not recent left ventricular apex, and large first diagonal that supplies the lateral left ventricular apex.  Minimal left coronary calcification First diagonal contains eccentric 40 to 50% ostial narrowing.  The LAD is normal. Left main is normal Large normal right coronary.  PDA reaches the left ventricular apex The circumflex contains proximal 25% narrowing. Normal left ventricular hemodynamics with LVEF 60%.   RECOMMENDATIONS:   Widely patent coronaries without critical obstructive coronary disease. Plan will be to initiate flecainide therapy and perform cardiac MRI (per Dr. Graciela Husbands).    Recent Labs: 03/06/2023: BUN 25; Creatinine, Ser 1.30; Hemoglobin 15.3; Platelets 160; Potassium 4.6; Sodium 136  No results found for requested labs within last 365 days.   Estimated Creatinine Clearance: 37.9 mL/min (A) (by C-G formula based on SCr of 1.3 mg/dL (H)).   Wt Readings from Last 3 Encounters:  03/06/23 163 lb (73.9 kg)  04/09/22 175 lb (79.4 kg)  10/22/21 178 lb (80.7 kg)     Other studies reviewed: Additional studies/records reviewed today include: summarized above  ASSESSMENT  AND PLAN:  1. 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  The patient did not have any concerns regarding medicines.  Norma Fredrickson, PA-C 03/10/2023 7:30 AM     [facility] [address] [place] [address] [phone] (office)  [phone] (fax)

## 2023-03-11 ENCOUNTER — Encounter: Payer: Self-pay | Admitting: Physician Assistant

## 2023-03-11 ENCOUNTER — Ambulatory Visit: Payer: Medicare PPO | Attending: Internal Medicine | Admitting: Physician Assistant

## 2023-03-11 VITALS — BP 124/68 | HR 68 | Ht 70.5 in | Wt 169.2 lb

## 2023-03-11 DIAGNOSIS — I472 Ventricular tachycardia, unspecified: Secondary | ICD-10-CM

## 2023-03-11 DIAGNOSIS — Z0000 Encounter for general adult medical examination without abnormal findings: Secondary | ICD-10-CM | POA: Diagnosis not present

## 2023-03-11 DIAGNOSIS — I4510 Unspecified right bundle-branch block: Secondary | ICD-10-CM | POA: Diagnosis not present

## 2023-03-11 DIAGNOSIS — R200 Anesthesia of skin: Secondary | ICD-10-CM | POA: Diagnosis not present

## 2023-03-11 DIAGNOSIS — Z23 Encounter for immunization: Secondary | ICD-10-CM | POA: Diagnosis not present

## 2023-03-11 DIAGNOSIS — E785 Hyperlipidemia, unspecified: Secondary | ICD-10-CM | POA: Diagnosis not present

## 2023-03-11 DIAGNOSIS — I2510 Atherosclerotic heart disease of native coronary artery without angina pectoris: Secondary | ICD-10-CM | POA: Diagnosis not present

## 2023-03-11 DIAGNOSIS — R82998 Other abnormal findings in urine: Secondary | ICD-10-CM | POA: Diagnosis not present

## 2023-03-11 DIAGNOSIS — E039 Hypothyroidism, unspecified: Secondary | ICD-10-CM | POA: Diagnosis not present

## 2023-03-11 DIAGNOSIS — N1831 Chronic kidney disease, stage 3a: Secondary | ICD-10-CM | POA: Diagnosis not present

## 2023-03-11 DIAGNOSIS — M8580 Other specified disorders of bone density and structure, unspecified site: Secondary | ICD-10-CM | POA: Diagnosis not present

## 2023-03-11 DIAGNOSIS — I700 Atherosclerosis of aorta: Secondary | ICD-10-CM | POA: Diagnosis not present

## 2023-03-11 DIAGNOSIS — K8020 Calculus of gallbladder without cholecystitis without obstruction: Secondary | ICD-10-CM | POA: Diagnosis not present

## 2023-03-11 NOTE — Patient Instructions (Signed)
Medication Instructions:   Your physician recommends that you continue on your current medications as directed. Please refer to the Current Medication list given to you today.  *If you need a refill on your cardiac medications before your next appointment, please call your pharmacy*   Lab Work: LFT TODAY   If you have labs (blood work) drawn today and your tests are completely normal, you will receive your results only by: MyChart Message (if you have MyChart) OR A paper copy in the mail If you have any lab test that is abnormal or we need to change your treatment, we will call you to review the results.   Testing/Procedures: NONE ORDERED  TODAY     Follow-Up: At [facility], you and your health needs are our priority.  As part of our continuing mission to provide you with exceptional heart care, we have created designated Provider Care Teams.  These Care Teams include your primary Cardiologist (physician) and Advanced Practice Providers (APPs -  Physician Assistants and Nurse Practitioners) who all work together to provide you with the care you need, when you need it.  We recommend signing up for the patient portal called "MyChart".  Sign up information is provided on this After Visit Summary.  MyChart is used to connect with patients for Virtual Visits (Telemedicine).  Patients are able to view lab/test results, encounter notes, upcoming appointments, etc.  Non-urgent messages can be sent to your provider as well.   To learn more about what you can do with MyChart, go to [email or web address].    Your next appointment:   1 year(s)  Provider:   You may see Dr. Graciela Husbands  or one of the following Advanced Practice Providers on your designated Care Team:   Francis Dowse, [place]  Other Instructions

## 2023-03-12 LAB — HEPATIC FUNCTION PANEL
ALT: 21 IU/L (ref 0–44)
AST: 21 IU/L (ref 0–40)
Albumin: 3.8 g/dL (ref 3.6–4.6)
Alkaline Phosphatase: 44 IU/L (ref 44–121)
Bilirubin Total: 0.4 mg/dL (ref 0.0–1.2)
Bilirubin, Direct: 0.14 mg/dL (ref 0.00–0.40)
Total Protein: 6.7 g/dL (ref 6.0–8.5)

## 2023-03-17 ENCOUNTER — Encounter (HOSPITAL_COMMUNITY)
Admission: RE | Disposition: A | Discharge status: Home / Self Care | Payer: Self-pay | Source: Home / Self Care | Attending: General Surgery

## 2023-03-17 ENCOUNTER — Other Ambulatory Visit: Payer: Self-pay

## 2023-03-17 ENCOUNTER — Ambulatory Visit (HOSPITAL_COMMUNITY)
Admission: RE | Admit: 2023-03-17 | Discharge: 2023-03-17 | Disposition: A | Discharge status: Home / Self Care | Payer: Medicare PPO | Attending: General Surgery | Admitting: General Surgery

## 2023-03-17 ENCOUNTER — Ambulatory Visit (HOSPITAL_BASED_OUTPATIENT_CLINIC_OR_DEPARTMENT_OTHER): Payer: Medicare PPO | Admitting: Anesthesiology

## 2023-03-17 ENCOUNTER — Encounter (HOSPITAL_COMMUNITY): Payer: Self-pay | Admitting: General Surgery

## 2023-03-17 ENCOUNTER — Ambulatory Visit (HOSPITAL_COMMUNITY): Payer: Medicare PPO | Admitting: Physician Assistant

## 2023-03-17 DIAGNOSIS — G4730 Sleep apnea, unspecified: Secondary | ICD-10-CM

## 2023-03-17 DIAGNOSIS — K8010 Calculus of gallbladder with chronic cholecystitis without obstruction: Secondary | ICD-10-CM

## 2023-03-17 DIAGNOSIS — I472 Ventricular tachycardia, unspecified: Secondary | ICD-10-CM | POA: Diagnosis not present

## 2023-03-17 DIAGNOSIS — I4510 Unspecified right bundle-branch block: Secondary | ICD-10-CM | POA: Insufficient documentation

## 2023-03-17 DIAGNOSIS — E039 Hypothyroidism, unspecified: Secondary | ICD-10-CM | POA: Insufficient documentation

## 2023-03-17 DIAGNOSIS — Z87891 Personal history of nicotine dependence: Secondary | ICD-10-CM | POA: Insufficient documentation

## 2023-03-17 DIAGNOSIS — K219 Gastro-esophageal reflux disease without esophagitis: Secondary | ICD-10-CM | POA: Insufficient documentation

## 2023-03-17 DIAGNOSIS — Z01818 Encounter for other preprocedural examination: Secondary | ICD-10-CM

## 2023-03-17 DIAGNOSIS — K811 Chronic cholecystitis: Secondary | ICD-10-CM | POA: Diagnosis not present

## 2023-03-17 HISTORY — PX: CHOLECYSTECTOMY: SHX55

## 2023-03-17 SURGERY — LAPAROSCOPIC CHOLECYSTECTOMY
Anesthesia: General | Site: Abdomen

## 2023-03-17 MED ORDER — BUPIVACAINE HCL (PF) 0.25 % IJ SOLN
INTRAMUSCULAR | Status: AC
  Filled 2023-03-17: qty 30

## 2023-03-17 MED ORDER — PROPOFOL 10 MG/ML IV BOLUS
INTRAVENOUS | Status: DC | PRN
  Administered 2023-03-17: 70 mg via INTRAVENOUS

## 2023-03-17 MED ORDER — PHENYLEPHRINE HCL (PRESSORS) 10 MG/ML IV SOLN
INTRAVENOUS | Status: AC
  Filled 2023-03-17: qty 1

## 2023-03-17 MED ORDER — CHLORHEXIDINE GLUCONATE 0.12 % MT SOLN
15.0000 mL | Freq: Once | OROMUCOSAL | Status: AC
  Administered 2023-03-17: 15 mL via OROMUCOSAL

## 2023-03-17 MED ORDER — CHLORHEXIDINE GLUCONATE CLOTH 2 % EX PADS: 6.0000 | MEDICATED_PAD | Freq: Once | CUTANEOUS | Status: DC

## 2023-03-17 MED ORDER — FENTANYL CITRATE (PF) 100 MCG/2ML IJ SOLN
INTRAMUSCULAR | Status: AC
  Filled 2023-03-17: qty 2

## 2023-03-17 MED ORDER — PHENYLEPHRINE HCL-NACL 20-0.9 MG/250ML-% IV SOLN
INTRAVENOUS | Status: DC | PRN
Start: 2023-03-17 — End: 2023-03-17
  Administered 2023-03-17: 30 ug/min via INTRAVENOUS

## 2023-03-17 MED ORDER — LIDOCAINE 2% (20 MG/ML) 5 ML SYRINGE
INTRAMUSCULAR | Status: DC | PRN
  Administered 2023-03-17: 80 mg via INTRAVENOUS

## 2023-03-17 MED ORDER — PROPOFOL 10 MG/ML IV BOLUS
INTRAVENOUS | Status: AC
  Filled 2023-03-17: qty 20

## 2023-03-17 MED ORDER — ONDANSETRON HCL 4 MG/2ML IJ SOLN: 4.0000 mg | Freq: Once | INTRAMUSCULAR | Status: DC | PRN

## 2023-03-17 MED ORDER — PHENYLEPHRINE 80 MCG/ML (10ML) SYRINGE FOR IV PUSH (FOR BLOOD PRESSURE SUPPORT)
PREFILLED_SYRINGE | INTRAVENOUS | Status: DC | PRN
  Administered 2023-03-17: 80 ug via INTRAVENOUS

## 2023-03-17 MED ORDER — EPHEDRINE SULFATE-NACL 50-0.9 MG/10ML-% IV SOSY
PREFILLED_SYRINGE | INTRAVENOUS | Status: DC | PRN
Start: 2023-03-17 — End: 2023-03-17
  Administered 2023-03-17: 5 mg via INTRAVENOUS
  Administered 2023-03-17: 10 mg via INTRAVENOUS
  Administered 2023-03-17: 5 mg via INTRAVENOUS

## 2023-03-17 MED ORDER — LACTATED RINGERS IR SOLN
Status: DC | PRN
  Administered 2023-03-17: 1000 mL

## 2023-03-17 MED ORDER — EPHEDRINE 5 MG/ML INJ
INTRAVENOUS | Status: AC
  Filled 2023-03-17: qty 5

## 2023-03-17 MED ORDER — ONDANSETRON HCL 4 MG/2ML IJ SOLN
INTRAMUSCULAR | Status: AC
  Filled 2023-03-17: qty 2

## 2023-03-17 MED ORDER — ORAL CARE MOUTH RINSE: 15.0000 mL | Freq: Once | OROMUCOSAL | Status: AC

## 2023-03-17 MED ORDER — LIDOCAINE HCL (PF) 2 % IJ SOLN
INTRAMUSCULAR | Status: AC
  Filled 2023-03-17: qty 5

## 2023-03-17 MED ORDER — LACTATED RINGERS IV SOLN: INTRAVENOUS | Status: DC

## 2023-03-17 MED ORDER — FENTANYL CITRATE (PF) 100 MCG/2ML IJ SOLN
INTRAMUSCULAR | Status: DC | PRN
  Administered 2023-03-17 (×2): 50 ug via INTRAVENOUS

## 2023-03-17 MED ORDER — ONDANSETRON HCL 4 MG/2ML IJ SOLN
INTRAMUSCULAR | Status: DC | PRN
  Administered 2023-03-17: 4 mg via INTRAVENOUS

## 2023-03-17 MED ORDER — DEXAMETHASONE SODIUM PHOSPHATE 10 MG/ML IJ SOLN
INTRAMUSCULAR | Status: AC
  Filled 2023-03-17: qty 1

## 2023-03-17 MED ORDER — DEXAMETHASONE SODIUM PHOSPHATE 10 MG/ML IJ SOLN
INTRAMUSCULAR | Status: DC | PRN
  Administered 2023-03-17: 4 mg via INTRAVENOUS

## 2023-03-17 MED ORDER — ROCURONIUM BROMIDE 10 MG/ML (PF) SYRINGE
PREFILLED_SYRINGE | INTRAVENOUS | Status: DC | PRN
  Administered 2023-03-17: 50 mg via INTRAVENOUS

## 2023-03-17 MED ORDER — SUGAMMADEX SODIUM 200 MG/2ML IV SOLN
INTRAVENOUS | Status: DC | PRN
  Administered 2023-03-17: 150 mg via INTRAVENOUS

## 2023-03-17 MED ORDER — PHENYLEPHRINE 80 MCG/ML (10ML) SYRINGE FOR IV PUSH (FOR BLOOD PRESSURE SUPPORT)
PREFILLED_SYRINGE | INTRAVENOUS | Status: AC
  Filled 2023-03-17: qty 10

## 2023-03-17 MED ORDER — ROCURONIUM BROMIDE 10 MG/ML (PF) SYRINGE
PREFILLED_SYRINGE | INTRAVENOUS | Status: AC
  Filled 2023-03-17: qty 10

## 2023-03-17 MED ORDER — CEFAZOLIN SODIUM-DEXTROSE 2-4 GM/100ML-% IV SOLN
2.0000 g | INTRAVENOUS | Status: AC
  Administered 2023-03-17: 2 g via INTRAVENOUS
  Filled 2023-03-17: qty 100

## 2023-03-17 MED ORDER — FENTANYL CITRATE PF 50 MCG/ML IJ SOSY: 25.0000 ug | PREFILLED_SYRINGE | INTRAMUSCULAR | Status: DC | PRN

## 2023-03-17 MED ORDER — ACETAMINOPHEN 500 MG PO TABS
1000.0000 mg | ORAL_TABLET | ORAL | Status: AC
  Administered 2023-03-17: 1000 mg via ORAL
  Filled 2023-03-17: qty 2

## 2023-03-17 MED ORDER — BUPIVACAINE HCL 0.25 % IJ SOLN
INTRAMUSCULAR | Status: DC | PRN
  Administered 2023-03-17: 30 mL

## 2023-03-17 SURGICAL SUPPLY — 48 items
APL PRP STRL LF DISP 70% ISPRP (MISCELLANEOUS) ×1
APL SKNCLS STERI-STRIP NONHPOA (GAUZE/BANDAGES/DRESSINGS)
APPLIER CLIP 5 13 M/L LIGAMAX5 (MISCELLANEOUS) ×1
APPLIER CLIP ROT 10 11.4 M/L (STAPLE)
APR CLP MED LRG 11.4X10 (STAPLE)
APR CLP MED LRG 5 ANG JAW (MISCELLANEOUS) ×1
BAG COUNTER SPONGE SURGICOUNT (BAG) IMPLANT
BAG SPEC RTRVL 10 TROC 200 (ENDOMECHANICALS) ×1
BAG SPNG CNTER NS LX DISP (BAG)
BENZOIN TINCTURE PRP APPL 2/3 (GAUZE/BANDAGES/DRESSINGS) IMPLANT
BNDG ADH 1X3 SHEER STRL LF (GAUZE/BANDAGES/DRESSINGS) IMPLANT
BNDG ADH THN 3X1 STRL LF (GAUZE/BANDAGES/DRESSINGS)
CABLE HIGH FREQUENCY MONO STRZ (ELECTRODE) ×1 IMPLANT
CHLORAPREP W/TINT 26 (MISCELLANEOUS) ×1 IMPLANT
CLIP APPLIE 5 13 M/L LIGAMAX5 (MISCELLANEOUS) ×1 IMPLANT
CLIP APPLIE ROT 10 11.4 M/L (STAPLE) IMPLANT
CLIP LIGATING HEM O LOK PURPLE (MISCELLANEOUS) IMPLANT
CLIP LIGATING HEMO O LOK GREEN (MISCELLANEOUS) ×1 IMPLANT
COVER MAYO STAND XLG (MISCELLANEOUS) ×1 IMPLANT
COVER SURGICAL LIGHT HANDLE (MISCELLANEOUS) ×1 IMPLANT
DRAIN CHANNEL 19F RND (DRAIN) IMPLANT
DRAIN RELI 100 BL SUC LF ST (DRAIN)
DRAPE C-ARM 42X120 X-RAY (DRAPES) IMPLANT
EVACUATOR SILICONE 100CC (DRAIN) IMPLANT
GLOVE BIOGEL PI IND STRL 7.0 (GLOVE) ×1 IMPLANT
GLOVE SURG SS PI 7.0 STRL IVOR (GLOVE) ×1 IMPLANT
GOWN STRL REUS W/ TWL LRG LVL3 (GOWN DISPOSABLE) ×1 IMPLANT
GOWN STRL REUS W/TWL LRG LVL3 (GOWN DISPOSABLE) ×1
GRASPER SUT TROCAR 14GX15 (MISCELLANEOUS) ×1 IMPLANT
IRRIG SUCT STRYKERFLOW 2 WTIP (MISCELLANEOUS) ×1
IRRIGATION SUCT STRKRFLW 2 WTP (MISCELLANEOUS) ×1 IMPLANT
KIT BASIN OR (CUSTOM PROCEDURE TRAY) ×1 IMPLANT
KIT TURNOVER KIT A (KITS) IMPLANT
POUCH RETRIEVAL ECOSAC 10 (ENDOMECHANICALS) ×1 IMPLANT
SCISSORS LAP 5X35 DISP (ENDOMECHANICALS) ×1 IMPLANT
SET CHOLANGIOGRAPH MIX (MISCELLANEOUS) IMPLANT
SET TUBE SMOKE EVAC HIGH FLOW (TUBING) ×1 IMPLANT
SLEEVE Z-THREAD 5X100MM (TROCAR) ×2 IMPLANT
SPIKE FLUID TRANSFER (MISCELLANEOUS) ×1 IMPLANT
STRIP CLOSURE SKIN 1/2X4 (GAUZE/BANDAGES/DRESSINGS) IMPLANT
SUT ETHILON 2 0 PS N (SUTURE) IMPLANT
SUT MNCRL AB 4-0 PS2 18 (SUTURE) ×1 IMPLANT
SUT VICRYL 0 ENDOLOOP (SUTURE) IMPLANT
TOWEL OR 17X26 10 PK STRL BLUE (TOWEL DISPOSABLE) ×1 IMPLANT
TOWEL OR NON WOVEN STRL DISP B (DISPOSABLE) IMPLANT
TRAY LAPAROSCOPIC (CUSTOM PROCEDURE TRAY) ×1 IMPLANT
TROCAR ADV FIXATION 12X100MM (TROCAR) ×1 IMPLANT
TROCAR Z-THREAD OPTICAL 5X100M (TROCAR) ×1 IMPLANT

## 2023-03-17 NOTE — Op Note (Signed)
PATIENT:  Jason Davies  89 y.o. male  PRE-OPERATIVE DIAGNOSIS:  CHRONIC CALCULOUS CHOLECYSTITIS  POST-OPERATIVE DIAGNOSIS:  CHRONIC CALCULOUS CHOLECYSTITIS  PROCEDURE:  Procedure(s): LAPAROSCOPIC CHOLECYSTECTOMY   SURGEON:  Aroldo Galli, De Blanch, MD   ASSISTANT: none  ANESTHESIA:   local and general  Indications for procedure: Jason Davies is a 89 y.o. male with symptoms of Abdominal pain consistent with gallbladder disease, Confirmed by ultrasound.  Description of procedure: The patient was brought into the operative suite, placed supine. Anesthesia was administered with endotracheal tube. Patient was strapped in place and foot board was secured. All pressure points were offloaded by foam padding. The patient was prepped and draped in the usual sterile fashion.  A periumbilical incision was made and optical entry was used to enter the abdomen. 2 5 mm trocars were placed on in the right lateral space on in the right subcostal space. A 12mm trocar was placed in the subxiphoid space. Marcaine was infused to the subxiphoid space and lateral upper right abdomen in the transversus abdominis plane. Next the patient was placed in reverse trendelenberg. The gallbladder appearedchronically inflamed.   The gallbladder was retracted cephalad and lateral. The peritoneum was reflected off the infundibulum working lateral to medial. The cystic duct and cystic artery were identified and further dissection revealed a critical view. The cystic duct and cystic artery were doubly clipped and ligated.   The gallbladder was removed off the liver bed with cautery. The Gallbladder was placed in a specimen bag. The gallbladder fossa was irrigated and hemostasis was applied with cautery. The gallbladder was removed via the 12mm trocar. The fascial defect was closed with interrupted 0 vicryl suture via laparoscopic trans-fascial suture passer. Pneumoperitoneum was removed, all trocar were removed. All incisions  were closed with 4-0 monocryl subcuticular stitch. The patient woke from anesthesia and was brought to PACU in stable condition. All counts were correct  Findings: chronically inflamed gallbladder with stones  Specimen: gallbladder  Blood loss: 30 ml  Local anesthesia: 30 ml Marcaine  Complications: none  PLAN OF CARE: Discharge to home after PACU  PATIENT DISPOSITION:  PACU - hemodynamically stable.  De Blanch [facility], [place]

## 2023-03-17 NOTE — Anesthesia Postprocedure Evaluation (Signed)
Anesthesia Post Note  Patient: Jason Davies  Procedure(s) Performed: LAPAROSCOPIC CHOLECYSTECTOMY (Abdomen)     Patient location during evaluation: PACU Anesthesia Type: General Level of consciousness: awake and alert Pain management: pain level controlled Vital Signs Assessment: post-procedure vital signs reviewed and stable Respiratory status: spontaneous breathing, nonlabored ventilation, respiratory function stable and patient connected to nasal cannula oxygen Cardiovascular status: blood pressure returned to baseline and stable Postop Assessment: no apparent nausea or vomiting Anesthetic complications: no  No notable events documented.  Last Vitals:  Vitals:   03/17/23 1630 03/17/23 1647  BP: (!) 154/84 (!) 158/81  Pulse: 62 67  Resp: 10   Temp: (!) 36.4 C   SpO2: 95% 95%    Last Pain:  Vitals:   03/17/23 1630  TempSrc:   PainSc: 0-No pain                 Huda Petrey L Peniel Biel

## 2023-03-17 NOTE — H&P (Signed)
Chief Complaint: New Consultation (Cholecystitis)   History of Present Illness: Jason Davies is a 89 y.o. male who is seen today as an office consultation at the request of Dr. Waynard Edwards for evaluation of New Consultation (Cholecystitis) .   He had nausea a few times in May. He had an attack lasting all night on 7/2. It came on after eating. It was epigastric pain that was intense. It resolved on its own after 12 h. He had been on a low fat diet since and has not had any symptoms.  Review of Systems: A complete review of systems was obtained from the patient. I have reviewed this information and discussed as appropriate with the patient. See HPI as well for other ROS.  Review of Systems  Constitutional: Negative.  HENT: Negative.  Eyes: Negative.  Respiratory: Negative.  Cardiovascular: Negative.  Gastrointestinal: Positive for abdominal pain.  Genitourinary: Negative.  Musculoskeletal: Negative.  Skin: Negative.  Neurological: Negative.  Endo/Heme/Allergies: Negative.  Psychiatric/Behavioral: Negative.    Medical History: Past Medical History:  Diagnosis Date  Arrhythmia  GERD (gastroesophageal reflux disease)  Hypertension  Sleep apnea  Thyroid disease   There is no problem list on file for this patient.  Past Surgical History:  Procedure Laterality Date  TONSILECTOMY   Allergies  Allergen Reactions  Mucinex D [Pseudoephedrine-Guaifenesin] Unknown   Current Outpatient Medications on File Prior to Visit  Medication Sig Dispense Refill  amiodarone (PACERONE) 100 MG tablet Take 100 mg by mouth once daily  levothyroxine (SYNTHROID) 50 MCG tablet Take 50 mcg by mouth once daily  multivitamin with minerals tablet Take 1 tablet by mouth once daily  omeprazole (PRILOSEC) 20 MG DR capsule Take 20 mg by mouth once daily  rosuvastatin (CRESTOR) 10 MG tablet Take 10 mg by mouth once daily   No current facility-administered medications on file prior to visit.   History  reviewed. No pertinent family history.  Social History   Tobacco Use  Smoking Status Former  Types: Cigarettes  Smokeless Tobacco Never   Social History   Socioeconomic History  Marital status: Married  Tobacco Use  Smoking status: Former  Types: Cigarettes  Smokeless tobacco: Never  Vaping Use  Vaping status: Never Used  Substance and Sexual Activity  Alcohol use: Yes  Drug use: Never   Objective:   Vitals:  02/05/23 1414  BP: 135/78  Pulse: 74  Temp: 36.5 C (97.7 F)  SpO2: 93%  Weight: 76.7 kg (169 lb)  Height: 177.8 cm (5\' 10" )  PainSc: 0-No pain   Body mass index is 24.25 kg/m.  Physical Exam Constitutional:  Appearance: Normal appearance.  HENT:  Head: Normocephalic and atraumatic.  Pulmonary:  Effort: Pulmonary effort is normal.  Musculoskeletal:  General: Normal range of motion.  Cervical back: Normal range of motion.  Neurological:  General: No focal deficit present.  Mental Status: He is alert and oriented to person, place, and time. Mental status is at baseline.  Psychiatric:  Mood and Affect: Mood normal.  Behavior: Behavior normal.  Thought Content: Thought content normal.     Labs, Imaging and Diagnostic Testing: Abdominal ultrasound and Abdominal/Pelvis CT results were reviewed showing gallstones I reviewed notes by Rodrigo Ran  Assessment and Plan:  Diagnoses and all orders for this visit:  Calculus of gallbladder with chronic cholecystitis without obstruction  Ventricular tachyarrhythmia (CMS/HHS-HCC)  RBBB (right bundle branch block)  Jason Davies is a 89 y.o. male with evidence of gallbladder disease. We discussed his age, he is  relatively active. We discussed additional issues with surgery given his age. I think he is healthy enough to tolerate a surgery. He has not had symptoms since July 2 and we discussed likelihood of future symptoms and whether or not he should have surgery at his age.  We discussed the etiology of  gallstones and biliary disease and that can cause pain. We discussed exacerbating factors including fatty meals. We discussed the details of surgery for removal of the gallbladder including general anesthesia, 4 small incisions in the patient's abdomen, removal of the patient's gallbladder with the liver and common bile duct, and most likely an outpatient procedure. We discussed risks of common bile duct injury, cystic duct stump leak, injury to liver, bleeding, infection, need for open procedure, and post cholecystectomy syndrome. He showed good understanding and will think about proceeding with laparoscopic cholecystectomy.  He will see his cardiologist in August.

## 2023-03-17 NOTE — Anesthesia Procedure Notes (Signed)
Procedure Name: Intubation Date/Time: 03/17/2023 3:00 PM  Performed by: Nelle Don, CRNAPre-anesthesia Checklist: Patient identified, Emergency Drugs available, Suction available and Patient being monitored Patient Re-evaluated:Patient Re-evaluated prior to induction Oxygen Delivery Method: Circle system utilized Preoxygenation: Pre-oxygenation with 100% oxygen Induction Type: IV induction Ventilation: Mask ventilation without difficulty Laryngoscope Size: Mac and 4 Grade View: Grade I Tube type: Oral Tube size: 7.5 mm Number of attempts: 1 Airway Equipment and Method: Stylet Placement Confirmation: ETT inserted through vocal cords under direct vision, positive ETCO2 and breath sounds checked- equal and bilateral Secured at: 23 cm Tube secured with: Tape Dental Injury: Teeth and Oropharynx as per pre-operative assessment

## 2023-03-17 NOTE — Anesthesia Preprocedure Evaluation (Signed)
Anesthesia Evaluation  Patient identified by MRN, date of birth, ID band Patient awake    Reviewed: Allergy & Precautions, NPO status , Patient's Chart, lab work & pertinent test results  Airway Mallampati: II  TM Distance: >3 FB Neck ROM: Full    Dental  (+) Teeth Intact, Dental Advisory Given   Pulmonary sleep apnea , former smoker   Pulmonary exam normal breath sounds clear to auscultation       Cardiovascular Normal cardiovascular exam+ dysrhythmias (RBBB)  Rhythm:Regular Rate:Normal     Neuro/Psych negative neurological ROS     GI/Hepatic ,GERD  Medicated,,CHRONIC CALCULOUS CHOLECYSTITIS   Endo/Other  Hypothyroidism    Renal/GU negative Renal ROS     Musculoskeletal negative musculoskeletal ROS (+)    Abdominal   Peds  Hematology negative hematology ROS (+)   Anesthesia Other Findings Day of surgery medications reviewed with the patient.  Reproductive/Obstetrics                             Anesthesia Physical Anesthesia Plan  ASA: 3  Anesthesia Plan: General   Post-op Pain Management: Tylenol PO (pre-op)*   Induction: Intravenous  PONV Risk Score and Plan: 2 and Dexamethasone and Ondansetron  Airway Management Planned: Oral ETT  Additional Equipment:   Intra-op Plan:   Post-operative Plan: Extubation in OR  Informed Consent: I have reviewed the patients History and Physical, chart, labs and discussed the procedure including the risks, benefits and alternatives for the proposed anesthesia with the patient or authorized representative who has indicated his/her understanding and acceptance.     Dental advisory given  Plan Discussed with: CRNA  Anesthesia Plan Comments:        Anesthesia Quick Evaluation

## 2023-03-17 NOTE — Transfer of Care (Signed)
Immediate Anesthesia Transfer of Care Note  Patient: Jason Davies  Procedure(s) Performed: LAPAROSCOPIC CHOLECYSTECTOMY (Abdomen)  Patient Location: PACU  Anesthesia Type:General  Level of Consciousness: drowsy  Airway & Oxygen Therapy: Patient Spontanous Breathing and Patient connected to face mask oxygen  Post-op Assessment: Report given to RN, Post -op Vital signs reviewed and stable, and Patient moving all extremities X 4  Post vital signs: Reviewed and stable  Last Vitals:  Vitals Value Taken Time  BP 176/90   Temp    Pulse 76 03/17/23 1554  Resp 13 03/17/23 1554  SpO2 100 % 03/17/23 1554  Vitals shown include unfiled device data.  Last Pain:  Vitals:   03/17/23 1330  TempSrc:   PainSc: 0-No pain      Patients Stated Pain Goal: 4 (03/17/23 1330)  Complications: No notable events documented.

## 2023-03-18 ENCOUNTER — Encounter (HOSPITAL_COMMUNITY): Payer: Self-pay | Admitting: General Surgery

## 2023-03-19 LAB — SURGICAL PATHOLOGY

## 2023-04-13 DIAGNOSIS — Z85828 Personal history of other malignant neoplasm of skin: Secondary | ICD-10-CM | POA: Diagnosis not present

## 2023-04-13 DIAGNOSIS — D225 Melanocytic nevi of trunk: Secondary | ICD-10-CM | POA: Diagnosis not present

## 2023-04-13 DIAGNOSIS — L570 Actinic keratosis: Secondary | ICD-10-CM | POA: Diagnosis not present

## 2023-04-13 DIAGNOSIS — L821 Other seborrheic keratosis: Secondary | ICD-10-CM | POA: Diagnosis not present

## 2023-04-13 DIAGNOSIS — L820 Inflamed seborrheic keratosis: Secondary | ICD-10-CM | POA: Diagnosis not present

## 2023-04-13 DIAGNOSIS — D485 Neoplasm of uncertain behavior of skin: Secondary | ICD-10-CM | POA: Diagnosis not present

## 2023-04-17 DIAGNOSIS — Z23 Encounter for immunization: Secondary | ICD-10-CM | POA: Diagnosis not present

## 2023-05-06 DIAGNOSIS — N401 Enlarged prostate with lower urinary tract symptoms: Secondary | ICD-10-CM | POA: Diagnosis not present

## 2023-05-06 DIAGNOSIS — R351 Nocturia: Secondary | ICD-10-CM | POA: Diagnosis not present

## 2023-05-06 DIAGNOSIS — R310 Gross hematuria: Secondary | ICD-10-CM | POA: Diagnosis not present

## 2023-06-18 DIAGNOSIS — H31001 Unspecified chorioretinal scars, right eye: Secondary | ICD-10-CM | POA: Diagnosis not present

## 2023-06-18 DIAGNOSIS — D3132 Benign neoplasm of left choroid: Secondary | ICD-10-CM | POA: Diagnosis not present

## 2023-06-18 DIAGNOSIS — H18593 Other hereditary corneal dystrophies, bilateral: Secondary | ICD-10-CM | POA: Diagnosis not present

## 2023-06-18 DIAGNOSIS — H5203 Hypermetropia, bilateral: Secondary | ICD-10-CM | POA: Diagnosis not present

## 2023-07-02 DIAGNOSIS — E785 Hyperlipidemia, unspecified: Secondary | ICD-10-CM | POA: Diagnosis not present

## 2023-07-02 DIAGNOSIS — E039 Hypothyroidism, unspecified: Secondary | ICD-10-CM | POA: Diagnosis not present

## 2023-07-02 DIAGNOSIS — I2510 Atherosclerotic heart disease of native coronary artery without angina pectoris: Secondary | ICD-10-CM | POA: Diagnosis not present

## 2024-02-05 LAB — LAB REPORT - SCANNED: EGFR: 56.5

## 2024-04-10 NOTE — Progress Notes (Unsigned)
 Cardiology Office Note   Date:  04/11/2024  ID:  Jason, Davies April 08, 1928, MRN [id number] PCP: Shayne Anes, MD  [facility] Providers Cardiologist:  Anes Parchment, MD Electrophysiologist:  Donnice DELENA Primus, MD   History of Present Illness Jason Davies is a 89 y.o. 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   He has been followed by Dr. Fernande who saw him on 10/22/2021.  At that time he was not having any recurrent VT so his amiodarone  was decreased to 100 mg daily.  He was seen by Charlies Arthur a year ago on 03/11/2023 and overall was doing well.  He was maintained on chronic amiodarone  with LFTs that were checked and within normal limits.  He is followed for his hypothyroidism by endocrine.   cMRI in 2019 was suspicious for ARVC.  No medication changes were made at his recent appointment.  He was evaluated as an outpatient for possible cholecystitis.  Abdominal ultrasound and CT A/P were consistent with cholelithiasis.  He was offered laparoscopic cholecystectomy.  He had not had symptoms since 01/13/2023 so they discussed whether or not it was reasonable to proceed with elective cholecystectomy.  He decided he did want to proceed in on 03/17/2023 he underwent cholecystectomy with findings of chronically inflamed gallbladder and stones.  Today he has no complaints and is overall doing well.  His wife is about to turn 7 years old and they live locally.  They have several children, grandchildren and great-grandchildren.  One of his daughters lives in [place].   ROS: None   Studies Reviewed  ECG review 04/11/24: NSR 77, PR 204, QRS 138, QT/c 414/468  cMRI  1. Normal left ventricular size, thickness and systolic function (LVEF = 59%). There are no regional wall motion abnormalities. There is no late gadolinium enhancement in the left ventricular myocardium. 2. 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  Physical Exam VS:  BP 136/80   Pulse 77   Ht 5' 10.5 (1.791 m)   Wt 172 lb (78 kg)   SpO2 94%   BMI 24.33 kg/m       Wt Readings from Last 3 Encounters:  04/11/24 172 lb (78 kg)  03/17/23 163 lb (73.9 kg)  03/11/23 169 lb 3.2 oz (76.7 kg)    GEN: Well nourished, well developed in no acute distress NECK: No JVD; No carotid bruits CARDIAC: RRR, no murmurs, rubs, gallops RESPIRATORY:  Clear to auscultation without rales, wheezing or rhonchi  ABDOMEN: Soft, non-tender, non-distended EXTREMITIES:  No edema; No deformity   ASSESSMENT AND PLAN AHKEEM Davies is a 89 y.o. 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   Exercise-induced VT No recurrent episodes. On synthroid and amiodarone  100 mg daily. Recent thyroid  studies WNLs with his PCP. CMP collected but unable to review remotely, will request records from PCP. Needs repeat PFTs now. Could consider d/c amio at next visit but if not having issues other than hypothyroidism would continue for now with no recurrent VT.      Dispo: RTC 1 year  A total of 30 minutes was spent preparing for the patient, reviewing history, performing exam, document encounter, coordinating care and counseling the patient. 15 minutes was spent with  direct patient care.   Signed, Donnice DELENA Primus, MD

## 2024-04-11 ENCOUNTER — Encounter: Payer: Self-pay | Admitting: Student in an Organized Health Care Education/Training Program

## 2024-04-11 ENCOUNTER — Ambulatory Visit
Attending: Student in an Organized Health Care Education/Training Program | Admitting: Student in an Organized Health Care Education/Training Program

## 2024-04-11 VITALS — BP 136/80 | HR 77 | Ht 70.5 in | Wt 172.0 lb

## 2024-04-11 DIAGNOSIS — I472 Ventricular tachycardia, unspecified: Secondary | ICD-10-CM | POA: Insufficient documentation

## 2024-04-11 NOTE — Patient Instructions (Signed)
 Medication Instructions:  Your physician recommends that you continue on your current medications as directed. Please refer to the Current Medication list given to you today.  *If you need a refill on your cardiac medications before your next appointment, please call your pharmacy*  Testing/Procedures: PFT Your physician has recommended that you have a pulmonary function test. Pulmonary Function Tests are a group of tests that measure how well air moves in and out of your lungs.   Follow-Up: At [facility], you and your health needs are our priority.  As part of our continuing mission to provide you with exceptional heart care, our providers are all part of one team.  This team includes your primary Cardiologist (physician) and Advanced Practice Providers or APPs (Physician Assistants and Nurse Practitioners) who all work together to provide you with the care you need, when you need it.  Your next appointment:   1 year  Provider:   You may see Donnice DELENA Primus, MD or one of the following Advanced Practice Providers on your designated Care Team:   Charlies Arthur, [place] Ozell Jodie Passey, PA-C Suzann Riddle, NP Daphne Barrack, NP Artist Pouch, PA-C

## 2024-04-14 ENCOUNTER — Ambulatory Visit (HOSPITAL_COMMUNITY)
Admission: RE | Admit: 2024-04-14 | Discharge: 2024-04-14 | Disposition: A | Discharge status: Home / Self Care | Source: Ambulatory Visit | Attending: Student in an Organized Health Care Education/Training Program | Admitting: Student in an Organized Health Care Education/Training Program

## 2024-04-14 DIAGNOSIS — R0609 Other forms of dyspnea: Secondary | ICD-10-CM | POA: Insufficient documentation

## 2024-04-14 DIAGNOSIS — I472 Ventricular tachycardia, unspecified: Secondary | ICD-10-CM | POA: Diagnosis present

## 2024-04-14 DIAGNOSIS — Z87891 Personal history of nicotine dependence: Secondary | ICD-10-CM | POA: Insufficient documentation

## 2024-04-14 LAB — PULMONARY FUNCTION TEST
DL/VA: 2.24 ml/min/mmHg/L
DLCO unc: 14.49 ml/min/mmHg
FEF 25-75 Pre: 1.58 L/s
FEF2575-%Pred-Pre: 128 %
FEV1-%Pred-Pre: 139 %
FEV1-Pre: 3.04 L
FEV1FVC-%Pred-Pre: 98 %
FEV6-%Pred-Pre: 142 %
FEV6-Pre: 4.27 L
FEV6FVC-%Pred-Pre: 104 %
FVC-%Pred-Pre: 136 %
FVC-Pre: 4.47 L
Pre FEV1/FVC ratio: 68 %
Pre FEV6/FVC Ratio: 95 %
RV % pred: 111 %
RV: 3.2 L
TLC % pred: 112 %
TLC: 7.78 L

## 2024-04-15 ENCOUNTER — Ambulatory Visit: Payer: Self-pay | Admitting: Student in an Organized Health Care Education/Training Program
